# Patient Record
Sex: Female | Born: 1960 | Race: White | Hispanic: No | Marital: Married | Smoking: Never smoker
Health system: Southern US, Community
[De-identification: ages and names within clinical notes are randomized; demographics above are authoritative.]

## PROBLEM LIST (undated history)

## (undated) DIAGNOSIS — D649 Anemia, unspecified: Secondary | ICD-10-CM

## (undated) DIAGNOSIS — F419 Anxiety disorder, unspecified: Secondary | ICD-10-CM

## (undated) HISTORY — PX: ABDOMINAL HYSTERECTOMY: SHX81

## (undated) HISTORY — DX: Anxiety disorder, unspecified: F41.9

## (undated) HISTORY — DX: Anemia, unspecified: D64.9

## (undated) HISTORY — PX: OTHER SURGICAL HISTORY: SHX169

---

## 1997-11-30 ENCOUNTER — Other Ambulatory Visit: Admission: RE | Admit: 1997-11-30 | Discharge: 1997-11-30 | Payer: Self-pay | Admitting: Gynecology

## 1999-05-26 ENCOUNTER — Other Ambulatory Visit: Admission: RE | Admit: 1999-05-26 | Discharge: 1999-05-26 | Payer: Self-pay | Admitting: Obstetrics and Gynecology

## 1999-11-27 ENCOUNTER — Inpatient Hospital Stay (HOSPITAL_COMMUNITY): Admission: AD | Admit: 1999-11-27 | Discharge: 1999-11-29 | Payer: Self-pay | Admitting: Obstetrics and Gynecology

## 1999-12-30 ENCOUNTER — Other Ambulatory Visit: Admission: RE | Admit: 1999-12-30 | Discharge: 1999-12-30 | Payer: Self-pay | Admitting: Obstetrics and Gynecology

## 2001-01-26 ENCOUNTER — Other Ambulatory Visit: Admission: RE | Admit: 2001-01-26 | Discharge: 2001-01-26 | Payer: Self-pay | Admitting: Gynecology

## 2002-02-22 ENCOUNTER — Other Ambulatory Visit: Admission: RE | Admit: 2002-02-22 | Discharge: 2002-02-22 | Payer: Self-pay | Admitting: Gynecology

## 2006-05-13 ENCOUNTER — Ambulatory Visit: Payer: Self-pay | Admitting: Family Medicine

## 2007-09-21 LAB — HM PAP SMEAR: HM Pap smear: NORMAL

## 2007-09-27 ENCOUNTER — Ambulatory Visit: Payer: Self-pay | Admitting: Family Medicine

## 2009-02-27 ENCOUNTER — Ambulatory Visit: Payer: Self-pay

## 2009-11-08 ENCOUNTER — Ambulatory Visit: Payer: Self-pay | Admitting: Obstetrics and Gynecology

## 2009-11-19 ENCOUNTER — Ambulatory Visit: Payer: Self-pay | Admitting: Obstetrics and Gynecology

## 2009-11-19 HISTORY — PX: OTHER SURGICAL HISTORY: SHX169

## 2009-11-21 LAB — PATHOLOGY REPORT

## 2010-05-28 ENCOUNTER — Ambulatory Visit: Payer: Self-pay | Admitting: Family Medicine

## 2010-05-28 LAB — HM MAMMOGRAPHY: HM MAMMO: NEGATIVE

## 2010-06-09 ENCOUNTER — Ambulatory Visit: Payer: Self-pay | Admitting: Gastroenterology

## 2010-06-09 LAB — HM COLONOSCOPY

## 2011-07-09 ENCOUNTER — Ambulatory Visit: Payer: Self-pay | Admitting: Family Medicine

## 2011-07-10 ENCOUNTER — Ambulatory Visit: Payer: Self-pay | Admitting: Family Medicine

## 2012-07-26 ENCOUNTER — Ambulatory Visit: Payer: Self-pay | Admitting: Family Medicine

## 2012-07-27 ENCOUNTER — Ambulatory Visit: Payer: Self-pay | Admitting: Family Medicine

## 2014-10-09 DIAGNOSIS — F419 Anxiety disorder, unspecified: Secondary | ICD-10-CM | POA: Insufficient documentation

## 2014-10-09 DIAGNOSIS — D509 Iron deficiency anemia, unspecified: Secondary | ICD-10-CM | POA: Insufficient documentation

## 2014-10-09 DIAGNOSIS — E559 Vitamin D deficiency, unspecified: Secondary | ICD-10-CM | POA: Insufficient documentation

## 2014-10-09 DIAGNOSIS — F439 Reaction to severe stress, unspecified: Secondary | ICD-10-CM | POA: Insufficient documentation

## 2014-10-09 DIAGNOSIS — G4700 Insomnia, unspecified: Secondary | ICD-10-CM | POA: Insufficient documentation

## 2014-10-09 DIAGNOSIS — Z8759 Personal history of other complications of pregnancy, childbirth and the puerperium: Secondary | ICD-10-CM | POA: Insufficient documentation

## 2014-10-09 DIAGNOSIS — Z8619 Personal history of other infectious and parasitic diseases: Secondary | ICD-10-CM | POA: Insufficient documentation

## 2014-10-12 ENCOUNTER — Encounter: Payer: Self-pay | Admitting: Physician Assistant

## 2014-10-12 ENCOUNTER — Ambulatory Visit (INDEPENDENT_AMBULATORY_CARE_PROVIDER_SITE_OTHER): Payer: 59 | Admitting: Physician Assistant

## 2014-10-12 VITALS — BP 102/60 | HR 70 | Temp 98.8°F | Resp 16 | Ht 62.0 in | Wt 118.8 lb

## 2014-10-12 DIAGNOSIS — Z1239 Encounter for other screening for malignant neoplasm of breast: Secondary | ICD-10-CM

## 2014-10-12 DIAGNOSIS — Z23 Encounter for immunization: Secondary | ICD-10-CM | POA: Diagnosis not present

## 2014-10-12 DIAGNOSIS — Z0000 Encounter for general adult medical examination without abnormal findings: Secondary | ICD-10-CM | POA: Diagnosis not present

## 2014-10-12 DIAGNOSIS — F419 Anxiety disorder, unspecified: Secondary | ICD-10-CM | POA: Diagnosis not present

## 2014-10-12 MED ORDER — ALPRAZOLAM 0.5 MG PO TABS: 0.5000 mg | ORAL_TABLET | Freq: Three times a day (TID) | ORAL | Status: DC | PRN

## 2014-10-12 NOTE — Patient Instructions (Signed)

## 2014-10-12 NOTE — Progress Notes (Signed)
Patient: Denise Gregory, Female    DOB: 06-23-1960, 54 y.o.   MRN: [id number] Visit Date: 10/12/2014  Today's Provider: Margaretann Loveless, PA-C   Chief Complaint  Patient presents with  . Annual Exam   Subjective:    Annual physical exam Denise Gregory is a 54 y.o. female who presents today for health maintenance and complete physical. She feels well. She reports exercising, walks 10 minutes at fast pace everyday. She reports she is sleeping poorly.  She states that the Xanax that she had before did help her sleep. She has no difficulties falling asleep but states that she does have trouble staying asleep. She states that she will wake up a couple times throughout the night. She has no known reason why she is waking up and she does have a difficult time falling back asleep. She has tried melatonin and it caused her to have "weird dreams".  She would like to try the Xanax again as it helped previously. She has tried over-the-counter sleep aids as well and states that they made her feel hyperactive instead of sleepy. She has started having occasional hot flashes but states they are not debilitating yet and does not want to seek treatment at this time. She is status post hysterectomy with right oophorectomy. The hysterectomy was secondary to uterine fibroids. The right oophorectomy was secondary to a tubal pregnancy. She has not had any complications since the hysterectomy. She did not get her mammogram last year but did have one the year before. She states the last 2 years that she had her mammogram she had to go back for a diagnostic ultrasound one time and the questionable area was found to be a cyst. The second time they did a diagnostic mammogram and she was cleared. There is no family history of breast cancer. She did have her screening colonoscopy at age 6. It was reported normal and follow-up colonoscopy in 10 years. There is no family history of colon cancer. She does  have some scars over her chest where there were some skin cancer and dysplastic areas removed. She is followed regularly by a dermatologist in [place].   Last PCP:10/10/2013    Review of Systems  Constitutional: Positive for diaphoresis.  HENT: Negative.   Eyes: Negative.   Respiratory: Negative.   Cardiovascular: Negative.   Gastrointestinal: Negative.   Endocrine: Negative.   Genitourinary: Negative.   Musculoskeletal: Negative.   Skin: Negative.   Allergic/Immunologic: Negative.   Neurological: Negative.   Hematological: Negative.   Psychiatric/Behavioral: Positive for sleep disturbance.    Social History She  reports that she has never smoked. She does not have any smokeless tobacco history on file. She reports that she drinks alcohol. She reports that she does not use illicit drugs. Social History   Social History  . Marital Status: Married    Spouse Name: N/A  . Number of Children: N/A  . Years of Education: N/A   Social History Main Topics  . Smoking status: Never Smoker   . Smokeless tobacco: None  . Alcohol Use: Yes     Comment: 1-2 glasses a week  . Drug Use: No  . Sexual Activity: Not Asked   Other Topics Concern  . None   Social History Narrative    Patient Active Problem List   Diagnosis Date Noted  . Anxiety 10/09/2014  . History of chicken pox 10/09/2014  . Ectopic pregnancy 10/09/2014  . Cannot sleep 10/09/2014  .  Anemia, iron deficiency 10/09/2014  . Feeling stressed out 10/09/2014  . Avitaminosis D 10/09/2014    Past Surgical History  Procedure Laterality Date  . Status post hysterectomy  11/19/2009    Due to fibroids; Still has right ovary  . Status post left oophorectomy      Due to tubal pregnancy    Family History  Family Status  Relation Status Death Age  . Mother Alive   . Father Deceased 43    Died from Lung Cancer  . Paternal Grandmother      senile Osteoporosis   Her family history includes Coronary artery  disease in her father; Heart attack in her father; Liver cancer in her father.    Allergies  Allergen Reactions  . Codeine Nausea Only    Previous Medications   No medications on file    Patient Care Team: Margaretann Loveless, PA-C as PCP - General (Physician Assistant)     Objective:   Vitals: BP 102/60 mmHg  Pulse 70  Temp(Src) 98.8 F (37.1 C) (Oral)  Resp 16  Ht  (1.575 m)  Wt 118 lb 12.8 oz (53.887 kg)  BMI 21.72 kg/m2   Physical Exam  Constitutional: She is oriented to person, place, and time. She appears well-developed and well-nourished. No distress.  HENT:  Head: Normocephalic and atraumatic.  Right Ear: Hearing, tympanic membrane, external ear and ear canal normal.  Left Ear: Hearing, tympanic membrane, external ear and ear canal normal.  Nose: Nose normal.  Mouth/Throat: Uvula is midline, oropharynx is clear and moist and mucous membranes are normal. No oropharyngeal exudate.  Eyes: Conjunctivae and EOM are normal. Pupils are equal, round, and reactive to light. Right eye exhibits no discharge. Left eye exhibits no discharge. No scleral icterus.  Neck: Normal range of motion. Neck supple. No JVD present. Carotid bruit is not present. No tracheal deviation present. No thyromegaly present.    Cardiovascular: Normal rate, regular rhythm, normal heart sounds and intact distal pulses.  Exam reveals no gallop and no friction rub.   No murmur heard. Pulmonary/Chest: Effort normal and breath sounds normal. No respiratory distress. She has no wheezes. She has no rales. She exhibits no tenderness. Right breast exhibits no inverted nipple, no mass, no nipple discharge, no skin change and no tenderness. Left breast exhibits no inverted nipple, no mass, no nipple discharge, no skin change and no tenderness. Breasts are symmetrical.  Abdominal: Soft. Bowel sounds are normal. She exhibits no distension and no mass. There is no tenderness. There is no rebound and no guarding.   Musculoskeletal: Normal range of motion. She exhibits no edema or tenderness.  Lymphadenopathy:    She has no cervical adenopathy.  Neurological: She is alert and oriented to person, place, and time.  Skin: Skin is warm and dry. No rash noted. She is not diaphoretic.  Psychiatric: She has a normal mood and affect. Her behavior is normal. Judgment and thought content normal.  Vitals reviewed.    Depression Screen No flowsheet data found.    Assessment & Plan:     Routine Health Maintenance and Physical Exam  Exercise Activities and Dietary recommendations Goals    None      Immunization History  Administered Date(s) Administered  . Influenza,inj,Quad PF,36+ Mos 10/12/2014  . Tdap 05/06/2010    Health Maintenance  Topic Date Due  . Hepatitis C Screening  1960-07-07  . HIV Screening  04/12/1975  . PAP SMEAR  09/21/2010  . MAMMOGRAM  05/27/2012  .  INFLUENZA VACCINE  08/20/2014  . COLONOSCOPY  06/09/2015  . TETANUS/TDAP  05/05/2020      Discussed health benefits of physical activity, and encouraged her to engage in regular exercise appropriate for her age and condition.   1. Annual physical exam Physical exam today was normal. She did have her lipids and hemoglobin A1c checked just a couple of months ago in the summer for her health screening biometrics through her employer. I will check other labs that were not checked on the biometrics screening as below. We will follow-up pending lab results or in one year if labs are normal. - CBC with Differential - Comprehensive metabolic panel - TSH  2. Need for influenza vaccination Flu vaccine was given today in the office. She tolerated this well and there were no complications. - Flu Vaccine QUAD 36+ mos IM  3. Breast cancer screening Breast exam today in the office was normal. She does have dense breast tissue. Screening mammogram was ordered and phone number for normal breast clinic was given to patient so that she  may call to schedule that appointment. - Mammogram Digital Screening; Future  4. Acute anxiety She uses the Xanax mostly for sleep. Occasionally she will have to take an extra pill throughout the day but that is very rare she states. She has been out of her Xanax for a while now she says. I will refill her Xanax as below. She is to call the office if she has any worsening symptoms. - ALPRAZolam (XANAX) 0.5 MG tablet; Take 1 tablet (0.5 mg total) by mouth every 8 (eight) hours as needed.  Dispense: 30 tablet; Refill: 1  --------------------------------------------------------------------

## 2014-10-13 LAB — CBC WITH DIFFERENTIAL/PLATELET
BASOS ABS: 0 10*3/uL (ref 0.0–0.2)
Basos: 0 %
EOS (ABSOLUTE): 0.2 10*3/uL (ref 0.0–0.4)
Eos: 2 %
Hematocrit: 41.1 % (ref 34.0–46.6)
Hemoglobin: 13.8 g/dL (ref 11.1–15.9)
IMMATURE GRANS (ABS): 0 10*3/uL (ref 0.0–0.1)
Immature Granulocytes: 0 %
LYMPHS: 31 %
Lymphocytes Absolute: 2.8 10*3/uL (ref 0.7–3.1)
MCH: 31.2 pg (ref 26.6–33.0)
MCHC: 33.6 g/dL (ref 31.5–35.7)
MCV: 93 fL (ref 79–97)
MONOS ABS: 0.7 10*3/uL (ref 0.1–0.9)
Monocytes: 8 %
NEUTROS PCT: 59 %
Neutrophils Absolute: 5.3 10*3/uL (ref 1.4–7.0)
PLATELETS: 309 10*3/uL (ref 150–379)
RBC: 4.43 x10E6/uL (ref 3.77–5.28)
RDW: 12.9 % (ref 12.3–15.4)
WBC: 9 10*3/uL (ref 3.4–10.8)

## 2014-10-13 LAB — COMPREHENSIVE METABOLIC PANEL
ALK PHOS: 63 IU/L (ref 39–117)
ALT: 42 IU/L — AB (ref 0–32)
AST: 35 IU/L (ref 0–40)
Albumin/Globulin Ratio: 1.9 (ref 1.1–2.5)
Albumin: 4.7 g/dL (ref 3.5–5.5)
BILIRUBIN TOTAL: 0.3 mg/dL (ref 0.0–1.2)
BUN/Creatinine Ratio: 31 — ABNORMAL HIGH (ref 9–23)
BUN: 16 mg/dL (ref 6–24)
CHLORIDE: 98 mmol/L (ref 97–108)
CO2: 22 mmol/L (ref 18–29)
Calcium: 10.2 mg/dL (ref 8.7–10.2)
Creatinine, Ser: 0.52 mg/dL — ABNORMAL LOW (ref 0.57–1.00)
GFR calc Af Amer: 125 mL/min/{1.73_m2} (ref >59)
GFR calc non Af Amer: 109 mL/min/{1.73_m2} (ref >59)
GLUCOSE: 90 mg/dL (ref 65–99)
Globulin, Total: 2.5 g/dL (ref 1.5–4.5)
POTASSIUM: 4.7 mmol/L (ref 3.5–5.2)
Sodium: 140 mmol/L (ref 134–144)
TOTAL PROTEIN: 7.2 g/dL (ref 6.0–8.5)

## 2014-10-13 LAB — TSH: TSH: 1.93 u[IU]/mL (ref 0.450–4.500)

## 2014-10-15 ENCOUNTER — Telehealth: Payer: Self-pay

## 2014-10-15 NOTE — Telephone Encounter (Signed)
-----   Message from Margaretann Loveless, [place] sent at 10/15/2014  3:21 PM EDT ----- All labs are within normal limits and stable.  Thanks! -JB

## 2014-10-15 NOTE — Telephone Encounter (Signed)
LMTCB 10/15/14  Thanks,  -Joseline 

## 2014-10-16 NOTE — Telephone Encounter (Signed)
Patient advised as directed below.  Thanks,  -Joseline 

## 2014-10-16 NOTE — Telephone Encounter (Signed)
LMTCB  Thanks,  -Joseline 

## 2014-10-26 ENCOUNTER — Ambulatory Visit
Admission: RE | Admit: 2014-10-26 | Discharge: 2014-10-26 | Disposition: A | Discharge status: Home / Self Care | Payer: 59 | Source: Ambulatory Visit | Attending: Physician Assistant | Admitting: Physician Assistant

## 2014-10-26 ENCOUNTER — Telehealth: Payer: Self-pay

## 2014-10-26 DIAGNOSIS — Z1239 Encounter for other screening for malignant neoplasm of breast: Secondary | ICD-10-CM

## 2014-10-26 DIAGNOSIS — Z1231 Encounter for screening mammogram for malignant neoplasm of breast: Secondary | ICD-10-CM | POA: Insufficient documentation

## 2014-10-26 NOTE — Telephone Encounter (Signed)
Advised pt as directed. Pt verbalized fully understanding.  Thanks,   

## 2014-10-26 NOTE — Telephone Encounter (Signed)
LMTCB 10/26/14  Thanks,  -Joseline 

## 2014-10-26 NOTE — Telephone Encounter (Signed)
-----   Message from Margaretann Loveless, PA-C sent at 10/26/2014 11:14 AM EDT ----- Normal mammogram.

## 2015-09-24 LAB — HM COLONOSCOPY

## 2015-10-14 ENCOUNTER — Encounter: Payer: Self-pay | Admitting: Physician Assistant

## 2015-10-14 ENCOUNTER — Ambulatory Visit (INDEPENDENT_AMBULATORY_CARE_PROVIDER_SITE_OTHER): Payer: 59 | Admitting: Physician Assistant

## 2015-10-14 VITALS — BP 112/70 | HR 72 | Temp 98.3°F | Resp 16 | Ht 62.0 in | Wt 124.6 lb

## 2015-10-14 DIAGNOSIS — Z833 Family history of diabetes mellitus: Secondary | ICD-10-CM | POA: Diagnosis not present

## 2015-10-14 DIAGNOSIS — Z0000 Encounter for general adult medical examination without abnormal findings: Secondary | ICD-10-CM

## 2015-10-14 DIAGNOSIS — Z136 Encounter for screening for cardiovascular disorders: Secondary | ICD-10-CM | POA: Diagnosis not present

## 2015-10-14 DIAGNOSIS — Z1159 Encounter for screening for other viral diseases: Secondary | ICD-10-CM

## 2015-10-14 DIAGNOSIS — Z1239 Encounter for other screening for malignant neoplasm of breast: Secondary | ICD-10-CM

## 2015-10-14 DIAGNOSIS — Z23 Encounter for immunization: Secondary | ICD-10-CM | POA: Diagnosis not present

## 2015-10-14 DIAGNOSIS — D509 Iron deficiency anemia, unspecified: Secondary | ICD-10-CM

## 2015-10-14 DIAGNOSIS — Z13220 Encounter for screening for lipoid disorders: Secondary | ICD-10-CM

## 2015-10-14 DIAGNOSIS — G4700 Insomnia, unspecified: Secondary | ICD-10-CM | POA: Diagnosis not present

## 2015-10-14 DIAGNOSIS — N951 Menopausal and female climacteric states: Secondary | ICD-10-CM

## 2015-10-14 DIAGNOSIS — R454 Irritability and anger: Secondary | ICD-10-CM | POA: Diagnosis not present

## 2015-10-14 MED ORDER — ALPRAZOLAM 0.5 MG PO TABS: 0.5000 mg | ORAL_TABLET | Freq: Every evening | ORAL | 3 refills | Status: DC | PRN

## 2015-10-14 NOTE — Progress Notes (Signed)
Patient: Denise Gregory, Female    DOB: 31-Aug-1960, 55 y.o.   MRN: [id number] Visit Date: 10/14/2015  Today's Provider: Margaretann Loveless, PA-C   Chief Complaint  Patient presents with  . Annual Exam   Subjective:    Annual physical exam Denise Gregory is a 55 y.o. female who presents today for health maintenance and complete physical. She feels well. She reports exercising none. She reports she is sleeping poorly (6-7 hours but it is broken). She reports that she has trouble staying asleep. She takes Benadryl sometimes. She also uses alprazolam for sleep. She reports increasing hot flashes and irritability. She does not wish to start medications at this time. Curious of OTC treatments first.   Last PCP: 10/12/14 Mammogram: 10/26/2014-BI-RADS Category 1: Negative Pap: 06/09/10; now s/p hysterectomy-no abnormal paps; no personal or family history of cervical/uterine or ovarian cancer Colonoscopy: 06/09/2010; most recent colonoscopy was 09/24/15 with Dr. Mechele Collin; repeat in 3 years -----------------------------------------------------------------   Review of Systems  Constitutional: Positive for unexpected weight change.       Irritability  HENT: Negative.   Eyes: Negative.   Respiratory: Negative.   Cardiovascular: Negative.   Gastrointestinal: Negative.   Endocrine: Negative.   Genitourinary: Negative.   Musculoskeletal: Negative.   Skin: Negative.   Allergic/Immunologic: Negative.   Neurological: Negative.   Hematological: Negative.   Psychiatric/Behavioral: Positive for dysphoric mood. The patient is nervous/anxious.   All other systems reviewed and are negative.   Social History      She  reports that she has never smoked. She has never used smokeless tobacco. She reports that she drinks alcohol. She reports that she does not use drugs.       Social History   Social History  . Marital status: Married    Spouse name: N/A  . Number of  children: N/A  . Years of education: N/A   Social History Main Topics  . Smoking status: Never Smoker  . Smokeless tobacco: Never Used  . Alcohol use Yes     Comment: 1-2 glasses a week  . Drug use: No  . Sexual activity: Not Asked   Other Topics Concern  . None   Social History Narrative  . None    Past Medical History:  Diagnosis Date  . Anemia   . Anxiety      Patient Active Problem List   Diagnosis Date Noted  . Anxiety 10/09/2014  . History of chicken pox 10/09/2014  . Ectopic pregnancy 10/09/2014  . Cannot sleep 10/09/2014  . Anemia, iron deficiency 10/09/2014  . Feeling stressed out 10/09/2014  . Avitaminosis D 10/09/2014    Past Surgical History:  Procedure Laterality Date  . Status post hysterectomy  11/19/2009   Due to fibroids; Still has right ovary  . status post left oophorectomy     Due to tubal pregnancy    Family History        Family Status  Relation Status  . Father Deceased at age 6   Died from Lung Cancer  . Mother Alive  . Paternal Grandmother    senile Osteoporosis  . Neg Hx         Her family history includes Coronary artery disease in her father; Heart attack in her father; Liver cancer in her father.    Allergies  Allergen Reactions  . Codeine Nausea Only    Current Meds  Medication Sig  . ALPRAZolam (XANAX) 0.5 MG tablet Take 1  tablet (0.5 mg total) by mouth every 8 (eight) hours as needed.    Patient Care Team: Margaretann Loveless, PA-C as PCP - General (Physician Assistant)     Objective:   Vitals: BP 112/70 (BP Location: Right Arm, Patient Position: Sitting, Cuff Size: Normal)   Pulse 72   Temp 98.3 F (36.8 C) (Oral)   Resp 16   Ht 5\' 2"  (1.575 m)   Wt 124 lb 9.6 oz (56.5 kg)   BMI 22.79 kg/m    Physical Exam  Constitutional: She is oriented to person, place, and time. She appears well-developed and well-nourished. No distress.  HENT:  Head: Normocephalic and atraumatic.  Right Ear: Hearing, tympanic  membrane, external ear and ear canal normal.  Left Ear: Hearing, tympanic membrane, external ear and ear canal normal.  Nose: Nose normal.  Mouth/Throat: Uvula is midline, oropharynx is clear and moist and mucous membranes are normal. No oropharyngeal exudate.  Eyes: Conjunctivae and EOM are normal. Pupils are equal, round, and reactive to light. Right eye exhibits no discharge. Left eye exhibits no discharge. No scleral icterus.  Neck: Normal range of motion. Neck supple. No JVD present. Carotid bruit is not present. No tracheal deviation present. No thyromegaly present.    Cardiovascular: Normal rate, regular rhythm, normal heart sounds and intact distal pulses.  Exam reveals no gallop and no friction rub.   No murmur heard. Pulmonary/Chest: Effort normal and breath sounds normal. No respiratory distress. She has no wheezes. She has no rales. She exhibits no tenderness. Right breast exhibits no inverted nipple, no mass, no nipple discharge, no skin change and no tenderness. Left breast exhibits no inverted nipple, no mass, no nipple discharge, no skin change and no tenderness. Breasts are symmetrical.  Abdominal: Soft. Bowel sounds are normal. She exhibits no distension and no mass. There is no tenderness. There is no rebound and no guarding.  Musculoskeletal: Normal range of motion. She exhibits no edema or tenderness.  Lymphadenopathy:    She has no cervical adenopathy.  Neurological: She is alert and oriented to person, place, and time.  Skin: Skin is warm and dry. No rash noted. She is not diaphoretic.  Psychiatric: She has a normal mood and affect. Her behavior is normal. Judgment and thought content normal.  Vitals reviewed.   Depression Screen No flowsheet data found.   Assessment & Plan:     Routine Health Maintenance and Physical Exam  Exercise Activities and Dietary recommendations Goals    None      Immunization History  Administered Date(s) Administered  .  Influenza,inj,Quad PF,36+ Mos 10/12/2014  . Tdap 05/06/2010    Health Maintenance  Topic Date Due  . Hepatitis C Screening  [birth year]  . HIV Screening  04/12/1975  . PAP SMEAR  09/21/2010  . COLONOSCOPY  06/09/2015  . INFLUENZA VACCINE  08/20/2015  . MAMMOGRAM  10/25/2016  . TETANUS/TDAP  05/05/2020      Discussed health benefits of physical activity, and encouraged her to engage in regular exercise appropriate for her age and condition.    1. Annual physical exam Normal physical exam today. Will check labs as below and f/u pending lab results. If labs are stable and WNL she will not need to have these rechecked for one year at her next annual physical exam. She is to call the office in the meantime if she has any acute issue, questions or concerns. - CBC with Differential/Platelet - Comprehensive metabolic panel - TSH  2. Breast cancer  screening Breast exam today was normal. There is no family history of breast cancer. She does perform regular self breast exams. Mammogram was ordered as below. [facility] was given to patient so she may schedule her mammogram at her convenience. - MM DIGITAL SCREENING BILATERAL; Future  3. Encounter for lipid screening for cardiovascular disease Will check labs as below and f/u pending results. - Lipid panel  4. Family history of diabetes mellitus (DM) Will check labs as below and f/u pending results. - Hemoglobin A1c  5. Anemia, iron deficiency Will check labs as below and f/u pending results. - CBC with Differential/Platelet  6. Cannot sleep Stable. Diagnosis pulled for medication refill. Continue current medical treatment plan. - ALPRAZolam (XANAX) 0.5 MG tablet; Take 1 tablet (0.5 mg total) by mouth at bedtime as needed.  Dispense: 30 tablet; Refill: 3  7. Irritability Worsening irritability and mood swings with hot flashes. Discussed menopausal symptoms in detail. She is going to start with Black cohosh  over-the-counter. If this does not help her symptoms she is to call the office. We did discuss nonhormonal versus hormonal treatments and the risk and benefits of each. If the black cohosh does not work she will let me know which she would prefer to start with.  8. Hot flash, menopausal See above medical treatment plan.  9. Need for hepatitis C screening test - Hepatitis C antibody  10. Need for influenza vaccination Flu vaccine given today without complication. Patient sat upright for 15 minutes to check for adverse reaction before being released. - Flu Vaccine QUAD 36+ mos IM  --------------------------------------------------------------------    Margaretann LovelessJennifer M Burnette, PA-C  [facility]

## 2015-10-14 NOTE — Patient Instructions (Signed)

## 2015-11-05 ENCOUNTER — Ambulatory Visit
Admission: RE | Admit: 2015-11-05 | Discharge: 2015-11-05 | Disposition: A | Discharge status: Home / Self Care | Payer: 59 | Source: Ambulatory Visit | Attending: Physician Assistant | Admitting: Physician Assistant

## 2015-11-05 DIAGNOSIS — Z1231 Encounter for screening mammogram for malignant neoplasm of breast: Secondary | ICD-10-CM | POA: Diagnosis not present

## 2015-11-05 DIAGNOSIS — Z1239 Encounter for other screening for malignant neoplasm of breast: Secondary | ICD-10-CM

## 2015-11-06 ENCOUNTER — Telehealth: Payer: Self-pay

## 2015-11-06 NOTE — Telephone Encounter (Signed)
Left message to call back  

## 2015-11-06 NOTE — Telephone Encounter (Signed)
-----   Message from Margaretann LovelessJennifer M Burnette, PA-C sent at 11/06/2015  8:22 AM EDT ----- Normal mammogram. Repeat screening in one year.

## 2015-11-06 NOTE — Telephone Encounter (Signed)
Pt is returning call.  [phone]/[id number]#[phone]/MW

## 2015-11-06 NOTE — Telephone Encounter (Signed)
Tried calling patient. Left message to call back. 

## 2015-11-06 NOTE — Telephone Encounter (Signed)
Patient advised and verbally voiced understanding.  

## 2016-07-29 ENCOUNTER — Encounter: Payer: Self-pay | Admitting: Physician Assistant

## 2016-07-29 ENCOUNTER — Ambulatory Visit (INDEPENDENT_AMBULATORY_CARE_PROVIDER_SITE_OTHER): Payer: 59 | Admitting: Physician Assistant

## 2016-07-29 VITALS — BP 110/70 | Temp 98.1°F | Resp 16 | Wt 129.0 lb

## 2016-07-29 DIAGNOSIS — R140 Abdominal distension (gaseous): Secondary | ICD-10-CM

## 2016-07-29 DIAGNOSIS — R1031 Right lower quadrant pain: Secondary | ICD-10-CM

## 2016-07-29 DIAGNOSIS — R197 Diarrhea, unspecified: Secondary | ICD-10-CM

## 2016-07-29 DIAGNOSIS — Z8759 Personal history of other complications of pregnancy, childbirth and the puerperium: Secondary | ICD-10-CM

## 2016-07-29 NOTE — Progress Notes (Signed)
Patient: Denise HuxleyClaudette E Pietila Female    DOB: 1960-09-30   56 y.o.   MRN: [id number] Visit Date: 07/29/2016  Today's Provider: Margaretann LovelessJennifer M Burnette, PA-C   Chief Complaint  Patient presents with  . Diarrhea   Subjective:    HPI Patient here today C/O diarrhea x's several weeks. Patient reports diarrhea is worse in the mornings having 3-4 from 6-10 am. Patient reports diarrhea also happens at night time. Patient reports bloating, cramping and passing a lot of gas. Patient denies any fever, nausea, vomiting, weight changes or blood in stools. Patient reports her family is well and has not had any similar symptoms. Patient reports taking one dose of OTC medication for diarrhea, reports no improvement. Patient denies UTI symptoms. Patient reports having colonoscopy last year in September 2017 that was normal. She has not been camping, no recent travel, no reptilian pets, no recent antibiotic use.     Allergies  Allergen Reactions  . Codeine Nausea Only     Current Outpatient Prescriptions:  .  ALPRAZolam (XANAX) 0.5 MG tablet, Take 1 tablet (0.5 mg total) by mouth at bedtime as needed., Disp: 30 tablet, Rfl: 3  Review of Systems  Constitutional: Negative for chills, fatigue and fever.  HENT: Negative.   Respiratory: Negative.   Cardiovascular: Negative.   Gastrointestinal: Positive for abdominal distention, abdominal pain and diarrhea. Negative for anal bleeding, blood in stool, constipation, nausea, rectal pain and vomiting.  Musculoskeletal: Negative for back pain.  Neurological: Negative.   Psychiatric/Behavioral: Negative.     Social History  Substance Use Topics  . Smoking status: Never Smoker  . Smokeless tobacco: Never Used  . Alcohol use Yes     Comment: 1-2 glasses a week   Objective:   There were no vitals taken for this visit. There were no vitals filed for this visit.   Physical Exam  Constitutional: She is oriented to person, place, and time. She  appears well-developed and well-nourished. No distress.  Cardiovascular: Normal rate, regular rhythm and normal heart sounds.  Exam reveals no gallop and no friction rub.   No murmur heard. Pulmonary/Chest: Effort normal and breath sounds normal. No respiratory distress. She has no wheezes. She has no rales.  Abdominal: Soft. Normal appearance and bowel sounds are normal. She exhibits no distension and no mass. There is no hepatosplenomegaly. There is tenderness in the right lower quadrant. There is guarding (rlq). There is no rebound and no CVA tenderness.  Neurological: She is alert and oriented to person, place, and time.  Skin: Skin is warm and dry. She is not diaphoretic.  Vitals reviewed.      Assessment & Plan:     1. Diarrhea, unspecified type DDx: subacute appendicitis, adhesions, functional diarrhea, constipation, infectious cause. Will check labs as below. I will get CT abdomen as below to r/o obstruction/adhesions vs appendicitis vs malignancy.  - CBC with Differential - Comprehensive Metabolic Panel (CMET) - Stool Culture - Stool C-Diff Toxin Assay - Ova and parasite examination - CT Abdomen Pelvis W Contrast; Future  2. RLQ abdominal pain See above medical treatment plan. - CBC with Differential - Comprehensive Metabolic Panel (CMET) - Stool Culture - Stool C-Diff Toxin Assay - Ova and parasite examination - CT Abdomen Pelvis W Contrast; Future  3. History of ectopic pregnancy Had oopherectomy many years ago due to ectopic pregnancy. She cannot remember now which side but believes it was the right side. This could lead to possibility of adhesions.  If it was the left side then I want to R/O any ovarian abnormality that may be causing a mass effect on the bowels.  - CBC with Differential - Comprehensive Metabolic Panel (CMET) - Stool Culture - Stool C-Diff Toxin Assay - Ova and parasite examination - CT Abdomen Pelvis W Contrast; Future  4. Bloating See above  medical treatment plan. - CBC with Differential - Comprehensive Metabolic Panel (CMET) - Stool Culture - Stool C-Diff Toxin Assay - Ova and parasite examination - CT Abdomen Pelvis W Contrast; Future       Margaretann Loveless, PA-C  [facility]

## 2016-07-29 NOTE — Patient Instructions (Signed)
Food Choices to Help Relieve Diarrhea, Adult When you have diarrhea, the foods you eat and your eating habits are very important. Choosing the right foods and drinks can help:  Relieve diarrhea.  Replace lost fluids and nutrients.  Prevent dehydration.  What general guidelines should I follow? Relieving diarrhea  Choose foods with less than 2 g or .07 oz. of fiber per serving.  Limit fats to less than 8 tsp (38 g or 1.34 oz.) a day.  Avoid the following: ? Foods and beverages sweetened with high-fructose corn syrup, honey, or sugar alcohols such as xylitol, sorbitol, and mannitol. ? Foods that contain a lot of fat or sugar. ? Fried, greasy, or spicy foods. ? High-fiber grains, breads, and cereals. ? Raw fruits and vegetables.  Eat foods that are rich in probiotics. These foods include dairy products such as yogurt and fermented milk products. They help increase healthy bacteria in the stomach and intestines (gastrointestinal tract, or GI tract).  If you have lactose intolerance, avoid dairy products. These may make your diarrhea worse.  Take medicine to help stop diarrhea (antidiarrheal medicine) only as told by your health care provider. Replacing nutrients  Eat small meals or snacks every 3-4 hours.  Eat bland foods, such as white rice, toast, or baked potato, until your diarrhea starts to get better. Gradually reintroduce nutrient-rich foods as tolerated or as told by your health care provider. This includes: ? Well-cooked protein foods. ? Peeled, seeded, and soft-cooked fruits and vegetables. ? Low-fat dairy products.  Take vitamin and mineral supplements as told by your health care provider. Preventing dehydration   Start by sipping water or a special solution to prevent dehydration (oral rehydration solution, ORS). Urine that is clear or pale yellow means that you are getting enough fluid.  Try to drink at least 8-10 cups of fluid each day to help replace lost  fluids.  You may add other liquids in addition to water, such as clear juice or decaffeinated sports drinks, as tolerated or as told by your health care provider.  Avoid drinks with caffeine, such as coffee, tea, or soft drinks.  Avoid alcohol. What foods are recommended? The items listed may not be a complete list. Talk with your health care provider about what dietary choices are best for you. Grains White rice. White, French, or pita breads (fresh or toasted), including plain rolls, buns, or bagels. White pasta. Saltine, soda, or graham crackers. Pretzels. Low-fiber cereal. Cooked cereals made with water (such as cornmeal, farina, or cream cereals). Plain muffins. Matzo. Melba toast. Zwieback. Vegetables Potatoes (without the skin). Most well-cooked and canned vegetables without skins or seeds. Tender lettuce. Fruits Apple sauce. Fruits canned in juice. Cooked apricots, cherries, grapefruit, peaches, pears, or plums. Fresh bananas and cantaloupe. Meats and other protein foods Baked or boiled chicken. Eggs. Tofu. Fish. Seafood. Smooth nut butters. Ground or well-cooked tender beef, ham, veal, lamb, pork, or poultry. Dairy Plain yogurt, kefir, and unsweetened liquid yogurt. Lactose-free milk, buttermilk, skim milk, or soy milk. Low-fat or nonfat hard cheese. Beverages Water. Low-calorie sports drinks. Fruit juices without pulp. Strained tomato and vegetable juices. Decaffeinated teas. Sugar-free beverages not sweetened with sugar alcohols. Oral rehydration solutions, if approved by your health care provider. Seasoning and other foods Bouillon, broth, or soups made from recommended foods. What foods are not recommended? The items listed may not be a complete list. Talk with your health care provider about what dietary choices are best for you. Grains Whole grain, whole wheat,   bran, or rye breads, rolls, pastas, and crackers. Wild or brown rice. Whole grain or bran cereals. Barley. Oats and  oatmeal. Corn tortillas or taco shells. Granola. Popcorn. Vegetables Raw vegetables. Fried vegetables. Cabbage, broccoli, Brussels sprouts, artichokes, baked beans, beet greens, corn, kale, legumes, peas, sweet potatoes, and yams. Potato skins. Cooked spinach and cabbage. Fruits Dried fruit, including raisins and dates. Raw fruits. Stewed or dried prunes. Canned fruits with syrup. Meat and other protein foods Fried or fatty meats. Deli meats. Chunky nut butters. Nuts and seeds. Beans and lentils. Bacon. Hot dogs. Sausage. Dairy High-fat cheeses. Whole milk, chocolate milk, and beverages made with milk, such as milk shakes. Half-and-half. Cream. sour cream. Ice cream. Beverages Caffeinated beverages (such as coffee, tea, soda, or energy drinks). Alcoholic beverages. Fruit juices with pulp. Prune juice. Soft drinks sweetened with high-fructose corn syrup or sugar alcohols. High-calorie sports drinks. Fats and oils Butter. Cream sauces. Margarine. Salad oils. Plain salad dressings. Olives. Avocados. Mayonnaise. Sweets and desserts Sweet rolls, doughnuts, and sweet breads. Sugar-free desserts sweetened with sugar alcohols such as xylitol and sorbitol. Seasoning and other foods Honey. Hot sauce. Chili powder. Gravy. Cream-based or milk-based soups. Pancakes and waffles. Summary  When you have diarrhea, the foods you eat and your eating habits are very important.  Make sure you get at least 8-10 cups of fluid each day, or enough to keep your urine clear or pale yellow.  Eat bland foods and gradually reintroduce healthy, nutrient-rich foods as tolerated, or as told by your health care provider.  Avoid high-fiber, fried, greasy, or spicy foods. This information is not intended to replace advice given to you by your health care provider. Make sure you discuss any questions you have with your health care provider. Document Released: 03/28/2003 Document Revised: 01/03/2016 Document Reviewed:  01/03/2016 Elsevier Interactive Patient Education  2017 Else[employer] Inc[employer]  

## 2016-07-30 ENCOUNTER — Encounter: Payer: Self-pay | Admitting: Physician Assistant

## 2016-07-30 LAB — COMPREHENSIVE METABOLIC PANEL
ALBUMIN: 4.6 g/dL (ref 3.5–5.5)
ALK PHOS: 63 IU/L (ref 39–117)
ALT: 25 IU/L (ref 0–32)
AST: 24 IU/L (ref 0–40)
Albumin/Globulin Ratio: 2.3 — ABNORMAL HIGH (ref 1.2–2.2)
BILIRUBIN TOTAL: 0.3 mg/dL (ref 0.0–1.2)
BUN / CREAT RATIO: 12 (ref 9–23)
BUN: 7 mg/dL (ref 6–24)
CHLORIDE: 103 mmol/L (ref 96–106)
CO2: 23 mmol/L (ref 20–29)
Calcium: 9.5 mg/dL (ref 8.7–10.2)
Creatinine, Ser: 0.59 mg/dL (ref 0.57–1.00)
GFR calc Af Amer: 118 mL/min/{1.73_m2} (ref >59)
GFR calc non Af Amer: 103 mL/min/{1.73_m2} (ref >59)
GLOBULIN, TOTAL: 2 g/dL (ref 1.5–4.5)
Glucose: 84 mg/dL (ref 65–99)
POTASSIUM: 3.8 mmol/L (ref 3.5–5.2)
SODIUM: 142 mmol/L (ref 134–144)
Total Protein: 6.6 g/dL (ref 6.0–8.5)

## 2016-07-30 LAB — CBC WITH DIFFERENTIAL/PLATELET
BASOS ABS: 0 10*3/uL (ref 0.0–0.2)
Basos: 0 %
EOS (ABSOLUTE): 0.1 10*3/uL (ref 0.0–0.4)
Eos: 1 %
HEMATOCRIT: 38.8 % (ref 34.0–46.6)
Hemoglobin: 13.3 g/dL (ref 11.1–15.9)
Immature Grans (Abs): 0 10*3/uL (ref 0.0–0.1)
Immature Granulocytes: 0 %
LYMPHS ABS: 2.6 10*3/uL (ref 0.7–3.1)
Lymphs: 32 %
MCH: 32.3 pg (ref 26.6–33.0)
MCHC: 34.3 g/dL (ref 31.5–35.7)
MCV: 94 fL (ref 79–97)
MONOS ABS: 0.6 10*3/uL (ref 0.1–0.9)
Monocytes: 7 %
NEUTROS ABS: 4.8 10*3/uL (ref 1.4–7.0)
Neutrophils: 60 %
Platelets: 282 10*3/uL (ref 150–379)
RBC: 4.12 x10E6/uL (ref 3.77–5.28)
RDW: 14.2 % (ref 12.3–15.4)
WBC: 8 10*3/uL (ref 3.4–10.8)

## 2016-08-05 ENCOUNTER — Telehealth: Payer: Self-pay | Admitting: Emergency Medicine

## 2016-08-05 LAB — CLOSTRIDIUM DIFFICILE EIA: C difficile Toxins A+B, EIA: NEGATIVE

## 2016-08-05 LAB — STOOL CULTURE: E COLI SHIGA TOXIN ASSAY: NEGATIVE

## 2016-08-05 NOTE — Telephone Encounter (Signed)
[email or web address]

## 2016-08-05 NOTE — Telephone Encounter (Signed)
-----   Message from Margaretann LovelessJennifer M Burnette, PA-C sent at 07/30/2016  1:26 PM EDT ----- Blood work is normal. Stool testing still pending

## 2016-08-06 ENCOUNTER — Ambulatory Visit: Payer: 59

## 2016-08-06 NOTE — Progress Notes (Signed)
Advised, patient states her symptoms are the same

## 2016-08-10 ENCOUNTER — Telehealth: Payer: Self-pay | Admitting: Physician Assistant

## 2016-08-10 NOTE — Addendum Note (Signed)
Addended by: Margaretann LovelessBURNETTE, [place] M on: 08/10/2016 01:20 PM   Modules accepted: Orders

## 2016-08-10 NOTE — Telephone Encounter (Signed)
Noted  

## 2016-08-10 NOTE — Telephone Encounter (Signed)
Pt called saying she was going to cancel her appt for the CT of Abd Pel.  She said this was going to cost too much money.  Her call back is  [phone]  Thanks teri

## 2016-08-12 ENCOUNTER — Ambulatory Visit: Admission: RE | Admit: 2016-08-12 | Payer: 59 | Source: Ambulatory Visit

## 2016-10-26 ENCOUNTER — Ambulatory Visit (INDEPENDENT_AMBULATORY_CARE_PROVIDER_SITE_OTHER): Payer: 59 | Admitting: Physician Assistant

## 2016-10-26 ENCOUNTER — Encounter: Payer: Self-pay | Admitting: Physician Assistant

## 2016-10-26 VITALS — BP 130/82 | HR 88 | Temp 97.8°F | Resp 16 | Ht 62.0 in | Wt 125.0 lb

## 2016-10-26 DIAGNOSIS — F5101 Primary insomnia: Secondary | ICD-10-CM

## 2016-10-26 DIAGNOSIS — F419 Anxiety disorder, unspecified: Secondary | ICD-10-CM | POA: Diagnosis not present

## 2016-10-26 DIAGNOSIS — Z23 Encounter for immunization: Secondary | ICD-10-CM

## 2016-10-26 DIAGNOSIS — Z0000 Encounter for general adult medical examination without abnormal findings: Secondary | ICD-10-CM

## 2016-10-26 DIAGNOSIS — E559 Vitamin D deficiency, unspecified: Secondary | ICD-10-CM | POA: Diagnosis not present

## 2016-10-26 DIAGNOSIS — Z1239 Encounter for other screening for malignant neoplasm of breast: Secondary | ICD-10-CM

## 2016-10-26 DIAGNOSIS — Z1231 Encounter for screening mammogram for malignant neoplasm of breast: Secondary | ICD-10-CM

## 2016-10-26 DIAGNOSIS — D509 Iron deficiency anemia, unspecified: Secondary | ICD-10-CM | POA: Diagnosis not present

## 2016-10-26 DIAGNOSIS — Z1159 Encounter for screening for other viral diseases: Secondary | ICD-10-CM

## 2016-10-26 MED ORDER — ALPRAZOLAM 0.5 MG PO TABS: 0.5000 mg | ORAL_TABLET | Freq: Every evening | ORAL | 5 refills | Status: DC | PRN

## 2016-10-26 NOTE — Patient Instructions (Signed)

## 2016-10-26 NOTE — Progress Notes (Signed)
Patient: Denise Gregory, Female    DOB: 04-May-1960, 56 y.o.   MRN: [id number] Visit Date: 10/26/2016  Today's Provider: Margaretann Loveless, PA-C   Chief Complaint  Patient presents with  . Annual Exam  . Anxiety   Subjective:    Annual physical exam Denise Gregory is a 56 y.o. female who presents today for health maintenance and complete physical. She feels well. She reports exercising. She reports she is sleeping well. 10/14/15 CPE 11/06/15 Mammogram-BI-RADS 1 06/09/10 Pap-Hysterectomy 09/24/15 Colonoscopy-polyps, recheck in 3-5 years -----------------------------------------------------------------  Review of Systems  Constitutional: Negative.   HENT: Negative.   Eyes: Negative.   Respiratory: Negative.   Cardiovascular: Negative.   Gastrointestinal: Positive for diarrhea.  Endocrine: Negative.   Genitourinary: Negative.   Musculoskeletal: Negative.   Skin: Negative.   Allergic/Immunologic: Negative.   Neurological: Negative.   Hematological: Negative.   Psychiatric/Behavioral: Positive for sleep disturbance.    Social History      She  reports that she has never smoked. She has never used smokeless tobacco. She reports that she drinks alcohol. She reports that she does not use drugs.       Social History   Social History  . Marital status: Married    Spouse name: N/A  . Number of children: N/A  . Years of education: N/A   Social History Main Topics  . Smoking status: Never Smoker  . Smokeless tobacco: Never Used  . Alcohol use Yes     Comment: 1-2 glasses a week  . Drug use: No  . Sexual activity: Not Asked   Other Topics Concern  . None   Social History Narrative  . None    Past Medical History:  Diagnosis Date  . Anemia   . Anxiety      Patient Active Problem List   Diagnosis Date Noted  . Anxiety 10/09/2014  . History of chicken pox 10/09/2014  . History of ectopic pregnancy 10/09/2014  . Cannot sleep  10/09/2014  . Anemia, iron deficiency 10/09/2014  . Feeling stressed out 10/09/2014  . Avitaminosis D 10/09/2014    Past Surgical History:  Procedure Laterality Date  . Status post hysterectomy  11/19/2009   Due to fibroids; Still has right ovary  . status post left oophorectomy     Due to tubal pregnancy    Family History        Family Status  Relation Status  . Father Deceased at age 69       Died from Lung Cancer  . Mother Alive  . PGM (Not Specified)       senile Osteoporosis  . Neg Hx (Not Specified)        Her family history includes Coronary artery disease in her father; Heart attack in her father; Liver cancer in her father.     Allergies  Allergen Reactions  . Codeine Nausea Only     Current Outpatient Prescriptions:  .  ALPRAZolam (XANAX) 0.5 MG tablet, Take 1 tablet (0.5 mg total) by mouth at bedtime as needed., Disp: 30 tablet, Rfl: 3   Patient Care Team: Margaretann Loveless, PA-C as PCP - General (Physician Assistant)      Objective:   Vitals: BP 130/82 (BP Location: Left Arm, Patient Position: Sitting, Cuff Size: Normal)   Pulse 88   Temp 97.8 F (36.6 C) (Oral)   Resp 16   Ht  (1.575 m)   Wt 125 lb (56.7 kg)  BMI 22.86 kg/m    Vitals:   10/26/16 1545  BP: 130/82  Pulse: 88  Resp: 16  Temp: 97.8 F (36.6 C)  TempSrc: Oral  Weight: 125 lb (56.7 kg)  Height:  (1.575 m)     Physical Exam  Constitutional: She is oriented to person, place, and time. She appears well-developed and well-nourished. No distress.  HENT:  Head: Normocephalic and atraumatic.  Right Ear: Hearing, tympanic membrane, external ear and ear canal normal.  Left Ear: Hearing, tympanic membrane, external ear and ear canal normal.  Nose: Nose normal.  Mouth/Throat: Uvula is midline, oropharynx is clear and moist and mucous membranes are normal. No oropharyngeal exudate.  Eyes: Pupils are equal, round, and reactive to light. Conjunctivae and EOM are normal.  Right eye exhibits no discharge. Left eye exhibits no discharge. No scleral icterus.  Neck: Normal range of motion. Neck supple. No JVD present. Carotid bruit is not present. No tracheal deviation present. No thyromegaly present.  Cardiovascular: Normal rate, regular rhythm, normal heart sounds and intact distal pulses.  Exam reveals no gallop and no friction rub.   No murmur heard. Pulmonary/Chest: Effort normal and breath sounds normal. No respiratory distress. She has no wheezes. She has no rales. She exhibits no tenderness. Right breast exhibits no inverted nipple, no mass, no nipple discharge, no skin change and no tenderness. Left breast exhibits no inverted nipple, no mass, no nipple discharge, no skin change and no tenderness. Breasts are symmetrical.  Abdominal: Soft. Bowel sounds are normal. She exhibits no distension and no mass. There is no tenderness. There is no rebound and no guarding.  Musculoskeletal: Normal range of motion. She exhibits no edema or tenderness.  Lymphadenopathy:    She has no cervical adenopathy.  Neurological: She is alert and oriented to person, place, and time. She has normal reflexes.  Skin: Skin is warm and dry. No rash noted. She is not diaphoretic.  Psychiatric: She has a normal mood and affect. Her behavior is normal. Judgment and thought content normal.  Vitals reviewed.    Depression Screen PHQ 2/9 Scores 10/26/2016 07/29/2016  PHQ - 2 Score 0 0  PHQ- 9 Score 3 -      Assessment & Plan:     Routine Health Maintenance and Physical Exam  Exercise Activities and Dietary recommendations Goals    None      Immunization History  Administered Date(s) Administered  . Influenza,inj,Quad PF,6+ Mos 10/12/2014, 10/14/2015  . Tdap 05/06/2010    Health Maintenance  Topic Date Due  . Hepatitis C Screening  10-24-1960  . HIV Screening  04/12/1975  . PAP SMEAR  09/21/2010  . INFLUENZA VACCINE  08/19/2016  . MAMMOGRAM  11/04/2017  . TETANUS/TDAP   05/05/2020  . COLONOSCOPY  09/23/2020     Discussed health benefits of physical activity, and encouraged her to engage in regular exercise appropriate for her age and condition.    1. Annual physical exam Normal physical exam today. Will check labs as below and f/u pending lab results. If labs are stable and WNL she will not need to have these rechecked for one year at her next annual physical exam. She is to call the office in the meantime if she has any acute issue, questions or concerns. - CBC w/Diff/Platelet - TSH  2. Breast cancer screening Breast exam today was normal. There is no family history of breast cancer. She does perform regular self breast exams. Mammogram was ordered as below. Information for  [facility] was given to patient so she may schedule her mammogram at her convenience. - MM DIGITAL SCREENING BILATERAL; Future  3. Anxiety Will check labs as below and f/u pending results. - TSH  4. Iron deficiency anemia, unspecified iron deficiency anemia type Will check labs as below and f/u pending results. - CBC w/Diff/Platelet  5. Avitaminosis D Will check labs as below and f/u pending results. - Vitamin D (25 hydroxy)  6. Need for hepatitis C screening test - Hepatitis C Antibody  7. Primary insomnia Stable. Diagnosis pulled for medication refill. Continue current medical treatment plan. - ALPRAZolam (XANAX) 0.5 MG tablet; Take 1 tablet (0.5 mg total) by mouth at bedtime as needed.  Dispense: 30 tablet; Refill: 5  8. Need for influenza vaccination Flu vaccine given today without complication. Patient sat upright for 15 minutes to check for adverse reaction before being released. - Flu Vaccine QUAD 36+ mos IM  --------------------------------------------------------------------    Margaretann Loveless, PA-C  [facility]

## 2016-10-27 ENCOUNTER — Telehealth: Payer: Self-pay

## 2016-10-27 LAB — CBC WITH DIFFERENTIAL/PLATELET
BASOS ABS: 0 10*3/uL (ref 0.0–0.2)
BASOS: 0 %
EOS (ABSOLUTE): 0.1 10*3/uL (ref 0.0–0.4)
Eos: 1 %
Hematocrit: 40.4 % (ref 34.0–46.6)
Hemoglobin: 13.6 g/dL (ref 11.1–15.9)
IMMATURE GRANULOCYTES: 0 %
Immature Grans (Abs): 0 10*3/uL (ref 0.0–0.1)
LYMPHS: 34 %
Lymphocytes Absolute: 3.1 10*3/uL (ref 0.7–3.1)
MCH: 31.9 pg (ref 26.6–33.0)
MCHC: 33.7 g/dL (ref 31.5–35.7)
MCV: 95 fL (ref 79–97)
MONOS ABS: 0.6 10*3/uL (ref 0.1–0.9)
Monocytes: 7 %
NEUTROS PCT: 58 %
Neutrophils Absolute: 5.3 10*3/uL (ref 1.4–7.0)
PLATELETS: 290 10*3/uL (ref 150–379)
RBC: 4.27 x10E6/uL (ref 3.77–5.28)
RDW: 13 % (ref 12.3–15.4)
WBC: 9.1 10*3/uL (ref 3.4–10.8)

## 2016-10-27 LAB — TSH: TSH: 2.33 u[IU]/mL (ref 0.450–4.500)

## 2016-10-27 LAB — HEPATITIS C ANTIBODY: Hep C Virus Ab: <0.1 s/co ratio (ref 0.0–0.9)

## 2016-10-27 LAB — VITAMIN D 25 HYDROXY (VIT D DEFICIENCY, FRACTURES): VIT D 25 HYDROXY: 29 ng/mL — AB (ref 30.0–100.0)

## 2016-10-27 NOTE — Telephone Encounter (Signed)
-----   Message from Margaretann Loveless, [place] sent at 10/27/2016  8:29 AM EDT ----- All labs are normal with exception of Vit D which is borderline low at 29. Recommend OTC Vit D supplement of 800-2000IU daily.

## 2016-10-27 NOTE — Telephone Encounter (Signed)
Patient advised as below. Patient verbalizes understanding and is in agreement with treatment plan.  

## 2016-10-29 ENCOUNTER — Encounter: Payer: Self-pay | Admitting: Physician Assistant

## 2016-11-18 ENCOUNTER — Ambulatory Visit
Admission: RE | Admit: 2016-11-18 | Discharge: 2016-11-18 | Disposition: A | Discharge status: Home / Self Care | Payer: 59 | Source: Ambulatory Visit | Attending: Physician Assistant | Admitting: Physician Assistant

## 2016-11-18 DIAGNOSIS — Z1239 Encounter for other screening for malignant neoplasm of breast: Secondary | ICD-10-CM

## 2016-11-18 DIAGNOSIS — Z1231 Encounter for screening mammogram for malignant neoplasm of breast: Secondary | ICD-10-CM | POA: Insufficient documentation

## 2016-11-21 ENCOUNTER — Telehealth: Payer: Self-pay

## 2016-11-21 NOTE — Telephone Encounter (Signed)
lmtcb

## 2016-11-21 NOTE — Telephone Encounter (Signed)
-----   Message from Margaretann LovelessJennifer M Burnette, [place]-C sent at 11/18/2016  6:00 PM EDT ----- Normal mammogram. Repeat screening in one year.

## 2016-11-23 NOTE — Telephone Encounter (Signed)
Pt reviewed in Mychart

## 2017-10-05 ENCOUNTER — Other Ambulatory Visit: Payer: Self-pay | Admitting: Physician Assistant

## 2017-10-05 DIAGNOSIS — Z1231 Encounter for screening mammogram for malignant neoplasm of breast: Secondary | ICD-10-CM

## 2017-10-27 ENCOUNTER — Encounter: Payer: Self-pay | Admitting: Physician Assistant

## 2017-10-27 ENCOUNTER — Ambulatory Visit (INDEPENDENT_AMBULATORY_CARE_PROVIDER_SITE_OTHER): Payer: Managed Care, Other (non HMO) | Admitting: Physician Assistant

## 2017-10-27 VITALS — BP 120/80 | HR 83 | Temp 97.5°F | Resp 16 | Ht 62.0 in | Wt 128.4 lb

## 2017-10-27 DIAGNOSIS — D509 Iron deficiency anemia, unspecified: Secondary | ICD-10-CM

## 2017-10-27 DIAGNOSIS — E559 Vitamin D deficiency, unspecified: Secondary | ICD-10-CM

## 2017-10-27 DIAGNOSIS — F5101 Primary insomnia: Secondary | ICD-10-CM | POA: Diagnosis not present

## 2017-10-27 DIAGNOSIS — Z0000 Encounter for general adult medical examination without abnormal findings: Secondary | ICD-10-CM | POA: Diagnosis not present

## 2017-10-27 DIAGNOSIS — Z23 Encounter for immunization: Secondary | ICD-10-CM

## 2017-10-27 DIAGNOSIS — E7800 Pure hypercholesterolemia, unspecified: Secondary | ICD-10-CM | POA: Insufficient documentation

## 2017-10-27 MED ORDER — ALPRAZOLAM 0.5 MG PO TABS: 0.5000 mg | ORAL_TABLET | Freq: Every evening | ORAL | 1 refills | Status: DC | PRN

## 2017-10-27 MED ORDER — ALPRAZOLAM 0.5 MG PO TABS: 0.5000 mg | ORAL_TABLET | Freq: Every evening | ORAL | 5 refills | Status: DC | PRN

## 2017-10-27 NOTE — Progress Notes (Signed)
Patient: Denise Gregory, Female    DOB: [birth year], 57 y.o.   MRN: [id number] Visit Date: 10/27/2017  Today's Provider: Margaretann Loveless, PA-C   Chief Complaint  Patient presents with  . Annual Exam   Subjective:    Annual physical exam Denise Gregory is a 57 y.o. female who presents today for health maintenance and complete physical. She feels well. She reports exercising some. She reports she is sleeping poorly, uses xanax for sleep prn. Her daughter is pregnant with a son, due in Nov. This is her first grandchild.   -----------------------------------------------------------------   Review of Systems  Constitutional: Negative.   HENT: Negative.   Eyes: Negative.   Respiratory: Negative.   Cardiovascular: Negative.   Gastrointestinal: Negative.   Endocrine: Negative.   Genitourinary: Negative.   Musculoskeletal: Negative.   Skin: Negative.   Allergic/Immunologic: Negative.   Neurological: Negative.   Hematological: Negative.   Psychiatric/Behavioral: Negative.     Social History      She  reports that she has never smoked. She has never used smokeless tobacco. She reports that she drinks alcohol. She reports that she does not use drugs.       Social History   Socioeconomic History  . Marital status: Married    Spouse name: Not on file  . Number of children: Not on file  . Years of education: Not on file  . Highest education level: Not on file  Occupational History  . Not on file  Social Needs  . Financial resource strain: Not on file  . Food insecurity:    Worry: Not on file    Inability: Not on file  . Transportation needs:    Medical: Not on file    Non-medical: Not on file  Tobacco Use  . Smoking status: Never Smoker  . Smokeless tobacco: Never Used  Substance and Sexual Activity  . Alcohol use: Yes    Comment: 1-2 glasses a week  . Drug use: No  . Sexual activity: Not on file  Lifestyle  . Physical activity:    Days  per week: Not on file    Minutes per session: Not on file  . Stress: Not on file  Relationships  . Social connections:    Talks on phone: Not on file    Gets together: Not on file    Attends religious service: Not on file    Active member of club or organization: Not on file    Attends meetings of clubs or organizations: Not on file    Relationship status: Not on file  Other Topics Concern  . Not on file  Social History Narrative  . Not on file    Past Medical History:  Diagnosis Date  . Anemia   . Anxiety      Patient Active Problem List   Diagnosis Date Noted  . Anxiety 10/09/2014  . History of chicken pox 10/09/2014  . History of ectopic pregnancy 10/09/2014  . Cannot sleep 10/09/2014  . Anemia, iron deficiency 10/09/2014  . Feeling stressed out 10/09/2014  . Avitaminosis D 10/09/2014    Past Surgical History:  Procedure Laterality Date  . ABDOMINAL HYSTERECTOMY    . Status post hysterectomy  11/19/2009   Due to fibroids; Still has right ovary  . status post left oophorectomy     Due to tubal pregnancy    Family History        Family Status  Relation Name Status  .  Father  Deceased at age 59       Died from Lung Cancer  . Mother  Alive  . PGM  (Not Specified)       senile Osteoporosis  . Neg Hx  (Not Specified)        Her family history includes Coronary artery disease in her father; Heart attack in her father; Liver cancer in her father. There is no history of Breast cancer.      Allergies  Allergen Reactions  . Codeine Nausea Only     Current Outpatient Medications:  .  ALPRAZolam (XANAX) 0.5 MG tablet, Take 1 tablet (0.5 mg total) by mouth at bedtime as needed., Disp: 30 tablet, Rfl: 5   Patient Care Team: Margaretann Loveless, PA-C as PCP - General (Physician Assistant)      Objective:   Vitals: BP 120/80 (BP Location: Left Arm, Patient Position: Sitting, Cuff Size: Normal)   Pulse 83   Temp (!) 97.5 F (36.4 C) (Oral)   Resp 16    Ht 5\' 2"  (1.575 m)   Wt 128 lb 6.4 oz (58.2 kg)   BMI 23.48 kg/m    Vitals:   10/27/17 1031  BP: 120/80  Pulse: 83  Resp: 16  Temp: (!) 97.5 F (36.4 C)  TempSrc: Oral  Weight: 128 lb 6.4 oz (58.2 kg)  Height: 5\' 2"  (1.575 m)     Physical Exam  Constitutional: She is oriented to person, place, and time. She appears well-developed and well-nourished. No distress.  HENT:  Head: Normocephalic and atraumatic.  Right Ear: Hearing, tympanic membrane, external ear and ear canal normal.  Left Ear: Hearing, tympanic membrane, external ear and ear canal normal.  Nose: Nose normal.  Mouth/Throat: Oropharynx is clear and moist. No oropharyngeal exudate.  Eyes: Pupils are equal, round, and reactive to light. Conjunctivae and EOM are normal. Right eye exhibits no discharge. Left eye exhibits no discharge. No scleral icterus.  Neck: Normal range of motion. Neck supple. No JVD present. No tracheal deviation present. No thyromegaly present.  Cardiovascular: Normal rate, regular rhythm, normal heart sounds and intact distal pulses. Exam reveals no gallop and no friction rub.  No murmur heard. Pulmonary/Chest: Effort normal and breath sounds normal. No respiratory distress. She has no wheezes. She has no rales. She exhibits no tenderness. Right breast exhibits no inverted nipple, no mass, no nipple discharge, no skin change and no tenderness. Left breast exhibits no inverted nipple, no mass, no nipple discharge, no skin change and no tenderness. No breast swelling, tenderness, discharge or bleeding. Breasts are symmetrical.  Abdominal: Soft. Bowel sounds are normal. She exhibits no distension and no mass. There is no tenderness. There is no rebound and no guarding.  Musculoskeletal: Normal range of motion. She exhibits no edema or tenderness.  Lymphadenopathy:    She has no cervical adenopathy.  Neurological: She is alert and oriented to person, place, and time.  Skin: Skin is warm and dry. No rash  noted. She is not diaphoretic.  Psychiatric: She has a normal mood and affect. Her behavior is normal. Judgment and thought content normal.  Vitals reviewed.    Depression Screen PHQ 2/9 Scores 10/27/2017 10/26/2016 07/29/2016  PHQ - 2 Score 0 0 0  PHQ- 9 Score - 3 -      Assessment & Plan:     Routine Health Maintenance and Physical Exam  Exercise Activities and Dietary recommendations Goals   None     Immunization History  Administered  Date(s) Administered  . Influenza,inj,Quad PF,6+ Mos 10/12/2014, 10/14/2015, 10/26/2016  . Tdap 05/06/2010    Health Maintenance  Topic Date Due  . HIV Screening  04/12/1975  . PAP SMEAR  09/21/2010  . INFLUENZA VACCINE  08/19/2017  . MAMMOGRAM  11/18/2017  . TETANUS/TDAP  05/05/2020  . COLONOSCOPY  09/23/2020  . Hepatitis C Screening  Completed     Discussed health benefits of physical activity, and encouraged her to engage in regular exercise appropriate for her age and condition.    1. Annual physical exam Normal physical exam today. Will check labs as below and f/u pending lab results. If labs are stable and WNL she will not need to have these rechecked for one year at her next annual physical exam. She is to call the office in the meantime if she has any acute issue, questions or concerns. - CBC with Differential/Platelet - Comprehensive metabolic panel  2. Primary insomnia Stable. Diagnosis pulled for medication refill. Continue current medical treatment plan. Will check labs as below and f/u pending results. - TSH - ALPRAZolam (XANAX) 0.5 MG tablet; Take 1 tablet (0.5 mg total) by mouth at bedtime as needed.  Dispense: 90 tablet; Refill: 1  3. Avitaminosis D H/O this and postmenopausal. Will check labs as below and f/u pending results. - CBC with Differential/Platelet - Comprehensive metabolic panel - Vitamin D (25 hydroxy)  4. Iron deficiency anemia, unspecified iron deficiency anemia type H/O this. Not on  supplementation. Will check labs as below and f/u pending results. - CBC with Differential/Platelet - Comprehensive metabolic panel - Fe+TIBC+Fer  5. Hypercholesterolemia Elevated on biometric screen. Patient declines statin therapy.   6. Need for influenza vaccination Flu vaccine given today without complication. Patient sat upright for 15 minutes to check for adverse reaction before being released. - Flu Vaccine QUAD 36+ mos IM  --------------------------------------------------------------------    Margaretann Loveless, PA-C  [facility]

## 2017-10-27 NOTE — Patient Instructions (Signed)
Health Maintenance for Postmenopausal Women Menopause is a normal process in which your reproductive ability comes to an end. This process happens gradually over a span of months to years, usually between the ages of 22 and 9. Menopause is complete when you have missed 12 consecutive menstrual periods. It is important to talk with your health care provider about some of the most common conditions that affect postmenopausal women, such as heart disease, cancer, and bone loss (osteoporosis). Adopting a healthy lifestyle and getting preventive care can help to promote your health and wellness. Those actions can also lower your chances of developing some of these common conditions. What should I know about menopause? During menopause, you may experience a number of symptoms, such as:  Moderate-to-severe hot flashes.  Night sweats.  Decrease in sex drive.  Mood swings.  Headaches.  Tiredness.  Irritability.  Memory problems.  Insomnia.  Choosing to treat or not to treat menopausal changes is an individual decision that you make with your health care provider. What should I know about hormone replacement therapy and supplements? Hormone therapy products are effective for treating symptoms that are associated with menopause, such as hot flashes and night sweats. Hormone replacement carries certain risks, especially as you become older. If you are thinking about using estrogen or estrogen with progestin treatments, discuss the benefits and risks with your health care provider. What should I know about heart disease and stroke? Heart disease, heart attack, and stroke become more likely as you age. This may be due, in part, to the hormonal changes that your body experiences during menopause. These can affect how your body processes dietary fats, triglycerides, and cholesterol. Heart attack and stroke are both medical emergencies. There are many things that you can do to help prevent heart disease  and stroke:  Have your blood pressure checked at least every 1-2 years. High blood pressure causes heart disease and increases the risk of stroke.  If you are 53-22 years old, ask your health care provider if you should take aspirin to prevent a heart attack or a stroke.  Do not use any tobacco products, including cigarettes, chewing tobacco, or electronic cigarettes. If you need help quitting, ask your health care provider.  It is important to eat a healthy diet and maintain a healthy weight. ? Be sure to include plenty of vegetables, fruits, low-fat dairy products, and lean protein. ? Avoid eating foods that are high in solid fats, added sugars, or salt (sodium).  Get regular exercise. This is one of the most important things that you can do for your health. ? Try to exercise for at least 150 minutes each week. The type of exercise that you do should increase your heart rate and make you sweat. This is known as moderate-intensity exercise. ? Try to do strengthening exercises at least twice each week. Do these in addition to the moderate-intensity exercise.  Know your numbers.Ask your health care provider to check your cholesterol and your blood glucose. Continue to have your blood tested as directed by your health care provider.  What should I know about cancer screening? There are several types of cancer. Take the following steps to reduce your risk and to catch any cancer development as early as possible. Breast Cancer  Practice breast self-awareness. ? This means understanding how your breasts normally appear and feel. ? It also means doing regular breast self-exams. Let your health care provider know about any changes, no matter how small.  If you are 40  or older, have a clinician do a breast exam (clinical breast exam or CBE) every year. Depending on your age, family history, and medical history, it may be recommended that you also have a yearly breast X-ray (mammogram).  If you  have a family history of breast cancer, talk with your health care provider about genetic screening.  If you are at high risk for breast cancer, talk with your health care provider about having an MRI and a mammogram every year.  Breast cancer (BRCA) gene test is recommended for women who have family members with BRCA-related cancers. Results of the assessment will determine the need for genetic counseling and BRCA1 and for BRCA2 testing. BRCA-related cancers include these types: ? Breast. This occurs in males or females. ? Ovarian. ? Tubal. This may also be called fallopian tube cancer. ? Cancer of the abdominal or pelvic lining (peritoneal cancer). ? Prostate. ? Pancreatic.  Cervical, Uterine, and Ovarian Cancer Your health care provider may recommend that you be screened regularly for cancer of the pelvic organs. These include your ovaries, uterus, and vagina. This screening involves a pelvic exam, which includes checking for microscopic changes to the surface of your cervix (Pap test).  For women ages 21-65, health care providers may recommend a pelvic exam and a Pap test every three years. For women ages 79-65, they may recommend the Pap test and pelvic exam, combined with testing for human papilloma virus (HPV), every five years. Some types of HPV increase your risk of cervical cancer. Testing for HPV may also be done on women of any age who have unclear Pap test results.  Other health care providers may not recommend any screening for nonpregnant women who are considered low risk for pelvic cancer and have no symptoms. Ask your health care provider if a screening pelvic exam is right for you.  If you have had past treatment for cervical cancer or a condition that could lead to cancer, you need Pap tests and screening for cancer for at least 20 years after your treatment. If Pap tests have been discontinued for you, your risk factors (such as having a new sexual partner) need to be  reassessed to determine if you should start having screenings again. Some women have medical problems that increase the chance of getting cervical cancer. In these cases, your health care provider may recommend that you have screening and Pap tests more often.  If you have a family history of uterine cancer or ovarian cancer, talk with your health care provider about genetic screening.  If you have vaginal bleeding after reaching menopause, tell your health care provider.  There are currently no reliable tests available to screen for ovarian cancer.  Lung Cancer Lung cancer screening is recommended for adults 69-62 years old who are at high risk for lung cancer because of a history of smoking. A yearly low-dose CT scan of the lungs is recommended if you:  Currently smoke.  Have a history of at least 30 pack-years of smoking and you currently smoke or have quit within the past 15 years. A pack-year is smoking an average of one pack of cigarettes per day for one year.  Yearly screening should:  Continue until it has been 15 years since you quit.  Stop if you develop a health problem that would prevent you from having lung cancer treatment.  Colorectal Cancer  This type of cancer can be detected and can often be prevented.  Routine colorectal cancer screening usually begins at  age 72 and continues through age 20.  If you have risk factors for colon cancer, your health care provider may recommend that you be screened at an earlier age.  If you have a family history of colorectal cancer, talk with your health care provider about genetic screening.  Your health care provider may also recommend using home test kits to check for hidden blood in your stool.  A small camera at the end of a tube can be used to examine your colon directly (sigmoidoscopy or colonoscopy). This is done to check for the earliest forms of colorectal cancer.  Direct examination of the colon should be repeated every  5-10 years until age 29. However, if early forms of precancerous polyps or small growths are found or if you have a family history or genetic risk for colorectal cancer, you may need to be screened more often.  Skin Cancer  Check your skin from head to toe regularly.  Monitor any moles. Be sure to tell your health care provider: ? About any new moles or changes in moles, especially if there is a change in a mole's shape or color. ? If you have a mole that is larger than the size of a pencil eraser.  If any of your family members has a history of skin cancer, especially at a young age, talk with your health care provider about genetic screening.  Always use sunscreen. Apply sunscreen liberally and repeatedly throughout the day.  Whenever you are outside, protect yourself by wearing long sleeves, pants, a wide-brimmed hat, and sunglasses.  What should I know about osteoporosis? Osteoporosis is a condition in which bone destruction happens more quickly than new bone creation. After menopause, you may be at an increased risk for osteoporosis. To help prevent osteoporosis or the bone fractures that can happen because of osteoporosis, the following is recommended:  If you are 63-20 years old, get at least 1,000 mg of calcium and at least 600 mg of vitamin D per day.  If you are older than age 85 but younger than age 70, get at least 1,200 mg of calcium and at least 600 mg of vitamin D per day.  If you are older than age 71, get at least 1,200 mg of calcium and at least 800 mg of vitamin D per day.  Smoking and excessive alcohol intake increase the risk of osteoporosis. Eat foods that are rich in calcium and vitamin D, and do weight-bearing exercises several times each week as directed by your health care provider. What should I know about how menopause affects my mental health? Depression may occur at any age, but it is more common as you become older. Common symptoms of depression  include:  Low or sad mood.  Changes in sleep patterns.  Changes in appetite or eating patterns.  Feeling an overall lack of motivation or enjoyment of activities that you previously enjoyed.  Frequent crying spells.  Talk with your health care provider if you think that you are experiencing depression. What should I know about immunizations? It is important that you get and maintain your immunizations. These include:  Tetanus, diphtheria, and pertussis (Tdap) booster vaccine.  Influenza every year before the flu season begins.  Pneumonia vaccine.  Shingles vaccine.  Your health care provider may also recommend other immunizations. This information is not intended to replace advice given to you by your health care provider. Make sure you discuss any questions you have with your health care provider. Document Released: 02/27/2005  Document Revised: 07/26/2015 Document Reviewed: 10/09/2014 Elsevier Interactive Patient Education  [employer].

## 2017-10-28 LAB — COMPREHENSIVE METABOLIC PANEL
A/G RATIO: 2.3 — AB (ref 1.2–2.2)
ALBUMIN: 4.9 g/dL (ref 3.5–5.5)
ALK PHOS: 59 IU/L (ref 39–117)
ALT: 43 IU/L — ABNORMAL HIGH (ref 0–32)
AST: 33 IU/L (ref 0–40)
BILIRUBIN TOTAL: 0.3 mg/dL (ref 0.0–1.2)
BUN/Creatinine Ratio: 20 (ref 9–23)
BUN: 11 mg/dL (ref 6–24)
CO2: 22 mmol/L (ref 20–29)
Calcium: 10 mg/dL (ref 8.7–10.2)
Chloride: 101 mmol/L (ref 96–106)
Creatinine, Ser: 0.56 mg/dL — ABNORMAL LOW (ref 0.57–1.00)
GFR calc non Af Amer: 104 mL/min/{1.73_m2} (ref >59)
GFR, EST AFRICAN AMERICAN: 120 mL/min/{1.73_m2} (ref >59)
GLOBULIN, TOTAL: 2.1 g/dL (ref 1.5–4.5)
GLUCOSE: 81 mg/dL (ref 65–99)
POTASSIUM: 4.1 mmol/L (ref 3.5–5.2)
Sodium: 144 mmol/L (ref 134–144)
TOTAL PROTEIN: 7 g/dL (ref 6.0–8.5)

## 2017-10-28 LAB — IRON,TIBC AND FERRITIN PANEL
FERRITIN: 166 ng/mL — AB (ref 15–150)
IRON SATURATION: 34 % (ref 15–55)
Iron: 89 ug/dL (ref 27–159)
TIBC: 262 ug/dL (ref 250–450)
UIBC: 173 ug/dL (ref 131–425)

## 2017-10-28 LAB — VITAMIN D 25 HYDROXY (VIT D DEFICIENCY, FRACTURES): VIT D 25 HYDROXY: 30.8 ng/mL (ref 30.0–100.0)

## 2017-10-28 LAB — CBC WITH DIFFERENTIAL/PLATELET
BASOS ABS: 0 10*3/uL (ref 0.0–0.2)
Basos: 0 %
EOS (ABSOLUTE): 0.1 10*3/uL (ref 0.0–0.4)
Eos: 1 %
Hematocrit: 39.4 % (ref 34.0–46.6)
Hemoglobin: 13.4 g/dL (ref 11.1–15.9)
Immature Grans (Abs): 0 10*3/uL (ref 0.0–0.1)
Immature Granulocytes: 0 %
LYMPHS ABS: 2.3 10*3/uL (ref 0.7–3.1)
Lymphs: 24 %
MCH: 31.6 pg (ref 26.6–33.0)
MCHC: 34 g/dL (ref 31.5–35.7)
MCV: 93 fL (ref 79–97)
Monocytes Absolute: 0.9 10*3/uL (ref 0.1–0.9)
Monocytes: 9 %
NEUTROS ABS: 6.4 10*3/uL (ref 1.4–7.0)
Neutrophils: 66 %
PLATELETS: 284 10*3/uL (ref 150–450)
RBC: 4.24 x10E6/uL (ref 3.77–5.28)
RDW: 12 % — ABNORMAL LOW (ref 12.3–15.4)
WBC: 9.7 10*3/uL (ref 3.4–10.8)

## 2017-10-28 LAB — TSH: TSH: 1.3 u[IU]/mL (ref 0.450–4.500)

## 2017-11-19 ENCOUNTER — Ambulatory Visit
Admission: RE | Admit: 2017-11-19 | Discharge: 2017-11-19 | Disposition: A | Discharge status: Home / Self Care | Payer: Managed Care, Other (non HMO) | Source: Ambulatory Visit | Attending: Physician Assistant | Admitting: Physician Assistant

## 2017-11-19 DIAGNOSIS — Z1231 Encounter for screening mammogram for malignant neoplasm of breast: Secondary | ICD-10-CM | POA: Insufficient documentation

## 2018-02-21 ENCOUNTER — Other Ambulatory Visit: Payer: Self-pay | Admitting: Physician Assistant

## 2018-02-21 DIAGNOSIS — F5101 Primary insomnia: Secondary | ICD-10-CM

## 2018-09-14 ENCOUNTER — Other Ambulatory Visit: Payer: Self-pay | Admitting: Physician Assistant

## 2018-09-14 DIAGNOSIS — F5101 Primary insomnia: Secondary | ICD-10-CM

## 2018-10-20 ENCOUNTER — Other Ambulatory Visit: Payer: Self-pay | Admitting: Physician Assistant

## 2018-10-20 DIAGNOSIS — Z1231 Encounter for screening mammogram for malignant neoplasm of breast: Secondary | ICD-10-CM

## 2018-11-11 NOTE — Progress Notes (Signed)
Patient: Denise Gregory, Female    DOB: [birth year], 58 y.o.   MRN: [id number] Visit Date: 11/14/2018  Today's Provider: Margaretann Loveless, PA-C   Chief Complaint  Patient presents with  . Annual Exam   Subjective:     Annual physical exam Denise Gregory is a 58 y.o. female who presents today for health maintenance and complete physical. She feels well. She reports exercising no. She reports she is sleeping well.  09/21/2007-Pap-normal; s/p hysterectomy  Mammogram Scheduled 11/28/2018.  Colonoscopy:09/24/15 polyps, recheck in 3-5 yrs.   Review of Systems  Constitutional: Negative.   HENT: Negative.   Eyes: Negative.   Respiratory: Negative.   Cardiovascular: Negative.   Gastrointestinal: Negative.   Endocrine: Negative.   Genitourinary: Negative.   Musculoskeletal: Negative.   Skin: Negative.   Allergic/Immunologic: Negative.   Neurological: Negative.   Hematological: Negative.   Psychiatric/Behavioral: Negative.     Social History      She  reports that she has never smoked. She has never used smokeless tobacco. She reports current alcohol use. She reports that she does not use drugs.       Social History   Socioeconomic History  . Marital status: Married    Spouse name: Not on file  . Number of children: Not on file  . Years of education: Not on file  . Highest education level: Not on file  Occupational History  . Not on file  Social Needs  . Financial resource strain: Not on file  . Food insecurity    Worry: Not on file    Inability: Not on file  . Transportation needs    Medical: Not on file    Non-medical: Not on file  Tobacco Use  . Smoking status: Never Smoker  . Smokeless tobacco: Never Used  Substance and Sexual Activity  . Alcohol use: Yes    Comment: 1-2 glasses a week  . Drug use: No  . Sexual activity: Not on file  Lifestyle  . Physical activity    Days per week: Not on file    Minutes per session: Not on file   . Stress: Not on file  Relationships  . Social Musician on phone: Not on file    Gets together: Not on file    Attends religious service: Not on file    Active member of club or organization: Not on file    Attends meetings of clubs or organizations: Not on file    Relationship status: Not on file  Other Topics Concern  . Not on file  Social History Narrative  . Not on file    Past Medical History:  Diagnosis Date  . Anemia   . Anxiety      Patient Active Problem List   Diagnosis Date Noted  . Hypercholesterolemia 10/27/2017  . Anxiety 10/09/2014  . History of chicken pox 10/09/2014  . History of ectopic pregnancy 10/09/2014  . Cannot sleep 10/09/2014  . Anemia, iron deficiency 10/09/2014  . Feeling stressed out 10/09/2014  . Avitaminosis D 10/09/2014    Past Surgical History:  Procedure Laterality Date  . ABDOMINAL HYSTERECTOMY    . Status post hysterectomy  11/19/2009   Due to fibroids; Still has right ovary  . status post left oophorectomy     Due to tubal pregnancy    Family History        Family Status  Relation Name Status  . Father  Deceased at age 16       Died from [place]  . Mother  Alive  . PGM  (Not Specified)       senile Osteoporosis  . Neg Hx  (Not Specified)        Her family history includes Coronary artery disease in her father; Heart attack in her father; Liver cancer in her father. There is no history of Breast cancer.      Allergies  Allergen Reactions  . Codeine Nausea Only     Current Outpatient Medications:  .  ALPRAZolam (XANAX) 0.5 MG tablet, TAKE 1 TABLET BY MOUTH EVERY NIGHT AT BEDTIME AS NEEDED, Disp: 30 tablet, Rfl: 5   Patient Care Team: Rubye Beach as PCP - General (Physician Assistant)    Objective:    Vitals: BP 128/80 (BP Location: Left Arm, Patient Position: Sitting, Cuff Size: Normal)   Pulse 85   Temp (!) 96.9 F (36.1 C) (Temporal)   Resp 16   Ht 5\' 2"  (1.575 m)   Wt  130 lb 3.2 oz (59.1 kg)   BMI 23.81 kg/m    Vitals:   11/14/18 0955  BP: 128/80  Pulse: 85  Resp: 16  Temp: (!) 96.9 F (36.1 C)  TempSrc: Temporal  Weight: 130 lb 3.2 oz (59.1 kg)  Height: 5\' 2"  (1.575 m)     Physical Exam Vitals signs reviewed.  Constitutional:      General: She is not in acute distress.    Appearance: Normal appearance. She is well-developed and normal weight. She is not ill-appearing or diaphoretic.  HENT:     Head: Normocephalic and atraumatic.     Right Ear: Tympanic membrane, ear canal and external ear normal.     Left Ear: Tympanic membrane, ear canal and external ear normal.     Nose: Nose normal.     Mouth/Throat:     Mouth: Mucous membranes are moist.     Pharynx: Oropharynx is clear. No oropharyngeal exudate.  Eyes:     General: No scleral icterus.       Right eye: No discharge.        Left eye: No discharge.     Extraocular Movements: Extraocular movements intact.     Conjunctiva/sclera: Conjunctivae normal.     Pupils: Pupils are equal, round, and reactive to light.  Neck:     Musculoskeletal: Normal range of motion and neck supple.     Thyroid: No thyromegaly.     Vascular: No carotid bruit or JVD.     Trachea: No tracheal deviation.  Cardiovascular:     Rate and Rhythm: Normal rate and regular rhythm.     Pulses: Normal pulses.     Heart sounds: Normal heart sounds. No murmur. No friction rub. No gallop.   Pulmonary:     Effort: Pulmonary effort is normal. No respiratory distress.     Breath sounds: Normal breath sounds. No wheezing or rales.  Chest:     Chest wall: No tenderness.  Abdominal:     General: Abdomen is flat. Bowel sounds are normal. There is no distension.     Palpations: Abdomen is soft. There is no mass.     Tenderness: There is no abdominal tenderness. There is no guarding or rebound.  Musculoskeletal: Normal range of motion.        General: No tenderness.     Right lower leg: No edema.     Left lower leg: No  edema.  Lymphadenopathy:     Cervical: No cervical adenopathy.  Skin:    General: Skin is warm and dry.     Capillary Refill: Capillary refill takes less than 2 seconds.     Findings: No rash.  Neurological:     General: No focal deficit present.     Mental Status: She is alert and oriented to person, place, and time. Mental status is at baseline.  Psychiatric:        Mood and Affect: Mood normal.        Behavior: Behavior normal.        Thought Content: Thought content normal.        Judgment: Judgment normal.      Depression Screen PHQ 2/9 Scores 11/14/2018 10/27/2017 10/26/2016 07/29/2016  PHQ - 2 Score 0 0 0 0  PHQ- 9 Score 1 - 3 -       Assessment & Plan:     Routine Health Maintenance and Physical Exam  Exercise Activities and Dietary recommendations Goals   None     Immunization History  Administered Date(s) Administered  . Influenza,inj,Quad PF,6+ Mos 10/12/2014, 10/14/2015, 10/26/2016, 10/27/2017  . Tdap 05/06/2010    Health Maintenance  Topic Date Due  . HIV Screening  04/12/1975  . PAP SMEAR-Modifier  09/21/2010  . INFLUENZA VACCINE  08/20/2018  . MAMMOGRAM  11/20/2018  . TETANUS/TDAP  05/05/2020  . COLONOSCOPY  09/23/2020  . Hepatitis C Screening  Completed     Discussed health benefits of physical activity, and encouraged her to engage in regular exercise appropriate for her age and condition.    1. Annual physical exam Normal physical exam today. Will check labs as below and f/u pending lab results. If labs are stable and WNL she will not need to have these rechecked for one year at her next annual physical exam. She is to call the office in the meantime if she has any acute issue, questions or concerns. - Comprehensive metabolic panel - TSH - HgB A1c  2. Avitaminosis D Postmenopausal and history of this. Will check labs as below and f/u pending results. - VITAMIN D 25 Hydroxy (Vit-D Deficiency, Fractures)  3. Iron deficiency anemia,  unspecified iron deficiency anemia type Will check labs as below and f/u pending results. - CBC with Differential/Platelet - TSH  4. Hypercholesterolemia Diet controlled. Strong family history of high cholesterol. Will check labs as below and f/u pending results. - Comprehensive metabolic panel - Lipid Panel With LDL/HDL Ratio - TSH - HgB [id number]  5. Need for influenza vaccination Flu vaccine given today without complication. Patient sat upright for 15 minutes to check for adverse reaction before being released. - Flu Vaccine QUAD 36+ mos IM  6. Need for shingles vaccine Shingrix #1 Vaccine given to patient without complications. Patient sat for 15 minutes after administration and was tolerated well without adverse effects. Return in 2 months for #2.  - Varicella-zoster vaccine IM  7. Screening for HIV without presence of risk factors Will check labs as below and f/u pending results. - HIV antibody (with reflex)  8. Weight gain Patient reports 10-20 pound weight gain over last few years. Tries to eat low carb and does not feel she overeats. Working from home for 2 months during covid she realized she may have snacked more. Has been trying to lose weight with exercise and diet control (not doing a food diary currently). Discussed starting food diary, like myfitnesspal. Also discussed medical nutrition evaluation which is what she  desires and agrees to. Will place referral below.  - Amb ref to Medical Nutrition Therapy-MNT  --------------------------------------------------------------------    Margaretann Loveless, PA-C  [facility]

## 2018-11-14 ENCOUNTER — Other Ambulatory Visit: Payer: Self-pay

## 2018-11-14 ENCOUNTER — Encounter: Payer: Self-pay | Admitting: Physician Assistant

## 2018-11-14 ENCOUNTER — Ambulatory Visit (INDEPENDENT_AMBULATORY_CARE_PROVIDER_SITE_OTHER): Payer: Managed Care, Other (non HMO) | Admitting: Physician Assistant

## 2018-11-14 ENCOUNTER — Other Ambulatory Visit: Payer: Self-pay | Admitting: Physician Assistant

## 2018-11-14 VITALS — BP 128/80 | HR 85 | Temp 96.9°F | Resp 16 | Ht 62.0 in | Wt 130.2 lb

## 2018-11-14 DIAGNOSIS — Z23 Encounter for immunization: Secondary | ICD-10-CM

## 2018-11-14 DIAGNOSIS — E7800 Pure hypercholesterolemia, unspecified: Secondary | ICD-10-CM | POA: Diagnosis not present

## 2018-11-14 DIAGNOSIS — Z0000 Encounter for general adult medical examination without abnormal findings: Secondary | ICD-10-CM | POA: Diagnosis not present

## 2018-11-14 DIAGNOSIS — E559 Vitamin D deficiency, unspecified: Secondary | ICD-10-CM | POA: Diagnosis not present

## 2018-11-14 DIAGNOSIS — Z114 Encounter for screening for human immunodeficiency virus [HIV]: Secondary | ICD-10-CM

## 2018-11-14 DIAGNOSIS — D509 Iron deficiency anemia, unspecified: Secondary | ICD-10-CM

## 2018-11-14 DIAGNOSIS — R635 Abnormal weight gain: Secondary | ICD-10-CM

## 2018-11-14 NOTE — Patient Instructions (Signed)

## 2018-11-15 ENCOUNTER — Telehealth: Payer: Self-pay

## 2018-11-15 LAB — COMPREHENSIVE METABOLIC PANEL
ALT: 31 IU/L (ref 0–32)
AST: 28 IU/L (ref 0–40)
Albumin/Globulin Ratio: 2.1 (ref 1.2–2.2)
Albumin: 4.4 g/dL (ref 3.8–4.9)
Alkaline Phosphatase: 61 IU/L (ref 39–117)
BUN/Creatinine Ratio: 14 (ref 9–23)
BUN: 7 mg/dL (ref 6–24)
Bilirubin Total: 0.3 mg/dL (ref 0.0–1.2)
CO2: 22 mmol/L (ref 20–29)
Calcium: 9.7 mg/dL (ref 8.7–10.2)
Chloride: 102 mmol/L (ref 96–106)
Creatinine, Ser: 0.5 mg/dL — ABNORMAL LOW (ref 0.57–1.00)
GFR calc Af Amer: 123 mL/min/{1.73_m2} (ref >59)
GFR calc non Af Amer: 107 mL/min/{1.73_m2} (ref >59)
Globulin, Total: 2.1 g/dL (ref 1.5–4.5)
Glucose: 89 mg/dL (ref 65–99)
Potassium: 3.9 mmol/L (ref 3.5–5.2)
Sodium: 141 mmol/L (ref 134–144)
Total Protein: 6.5 g/dL (ref 6.0–8.5)

## 2018-11-15 LAB — CBC WITH DIFFERENTIAL/PLATELET
Basophils Absolute: 0 10*3/uL (ref 0.0–0.2)
Basos: 0 %
EOS (ABSOLUTE): 0.1 10*3/uL (ref 0.0–0.4)
Eos: 1 %
Hematocrit: 37.5 % (ref 34.0–46.6)
Hemoglobin: 13.1 g/dL (ref 11.1–15.9)
Immature Grans (Abs): 0 10*3/uL (ref 0.0–0.1)
Immature Granulocytes: 0 %
Lymphocytes Absolute: 2.4 10*3/uL (ref 0.7–3.1)
Lymphs: 26 %
MCH: 32.7 pg (ref 26.6–33.0)
MCHC: 34.9 g/dL (ref 31.5–35.7)
MCV: 94 fL (ref 79–97)
Monocytes Absolute: 0.8 10*3/uL (ref 0.1–0.9)
Monocytes: 9 %
Neutrophils Absolute: 5.8 10*3/uL (ref 1.4–7.0)
Neutrophils: 64 %
Platelets: 304 10*3/uL (ref 150–450)
RBC: 4.01 x10E6/uL (ref 3.77–5.28)
RDW: 11.4 % — ABNORMAL LOW (ref 11.7–15.4)
WBC: 9.1 10*3/uL (ref 3.4–10.8)

## 2018-11-15 LAB — LIPID PANEL WITH LDL/HDL RATIO
Cholesterol, Total: 257 mg/dL — ABNORMAL HIGH (ref 100–199)
HDL: 75 mg/dL (ref >39)
LDL Chol Calc (NIH): 164 mg/dL — ABNORMAL HIGH (ref 0–99)
LDL/HDL Ratio: 2.2 ratio (ref 0.0–3.2)
Triglycerides: 103 mg/dL (ref 0–149)
VLDL Cholesterol Cal: 18 mg/dL (ref 5–40)

## 2018-11-15 LAB — HEMOGLOBIN A1C
Est. average glucose Bld gHb Est-mCnc: 103 mg/dL
Hgb A1c MFr Bld: 5.2 % (ref 4.8–5.6)

## 2018-11-15 LAB — TSH: TSH: 2.28 u[IU]/mL (ref 0.450–4.500)

## 2018-11-15 LAB — VITAMIN D 25 HYDROXY (VIT D DEFICIENCY, FRACTURES): Vit D, 25-Hydroxy: 21.6 ng/mL — ABNORMAL LOW (ref 30.0–100.0)

## 2018-11-15 LAB — HIV ANTIBODY (ROUTINE TESTING W REFLEX): HIV Screen 4th Generation wRfx: NONREACTIVE

## 2018-11-15 NOTE — Telephone Encounter (Signed)
-----   Message from Mar Daring, [place] sent at 11/15/2018  1:35 PM EDT ----- Blood count is normal. Kidney and liver function are normal. Sodium, potassium and calcium are normal. Cholesterol remains elevated but has improved compared to previous labs. Your previous total cholesterol was 274 and it is now 257. LDL was 166 and is now 164. A1c/sugar are normal. Thyroid is normal. Vit D is low at 21.6. Recommend OTC Vit D supplement of 2000 IU daily. HIV screen done once in a lifetime, unless exposed, is negative.

## 2018-11-15 NOTE — Telephone Encounter (Signed)
Viewed by Rhona Raider on 11/15/2018 1:36 PM

## 2018-11-28 ENCOUNTER — Ambulatory Visit
Admission: RE | Admit: 2018-11-28 | Discharge: 2018-11-28 | Disposition: A | Discharge status: Home / Self Care | Payer: Managed Care, Other (non HMO) | Source: Ambulatory Visit | Attending: Physician Assistant | Admitting: Physician Assistant

## 2018-11-28 ENCOUNTER — Telehealth: Payer: Self-pay

## 2018-11-28 DIAGNOSIS — Z1231 Encounter for screening mammogram for malignant neoplasm of breast: Secondary | ICD-10-CM | POA: Diagnosis not present

## 2018-11-28 NOTE — Telephone Encounter (Signed)
Patient advised as directed below. 

## 2018-11-28 NOTE — Telephone Encounter (Signed)
-----   Message from Mar Daring, [place] sent at 11/28/2018  4:28 PM EST ----- Normal mammogram. Repeat screening in one year.

## 2019-01-18 ENCOUNTER — Other Ambulatory Visit: Payer: Self-pay

## 2019-01-18 ENCOUNTER — Ambulatory Visit (INDEPENDENT_AMBULATORY_CARE_PROVIDER_SITE_OTHER): Payer: Managed Care, Other (non HMO) | Admitting: Physician Assistant

## 2019-01-18 DIAGNOSIS — Z23 Encounter for immunization: Secondary | ICD-10-CM | POA: Diagnosis not present

## 2019-01-21 ENCOUNTER — Encounter: Payer: Self-pay | Admitting: Physician Assistant

## 2019-03-24 ENCOUNTER — Ambulatory Visit: Payer: Managed Care, Other (non HMO) | Attending: Internal Medicine

## 2019-03-24 DIAGNOSIS — Z23 Encounter for immunization: Secondary | ICD-10-CM | POA: Insufficient documentation

## 2019-03-24 NOTE — Progress Notes (Signed)
   Covid-19 Vaccination Clinic  Name:  Denise Gregory    MRN: [id number] DOB: October 16, 1960  03/24/2019  [name] was observed post Covid-19 immunization for 15 minutes without incident. She was provided with Vaccine Information Sheet and instruction to access the V-Safe system.   [name] was instructed to call 911 with any severe reactions post vaccine: Marland Kitchen Difficulty breathing  . Swelling of face and throat  . A fast heartbeat  . A bad rash all over body  . Dizziness and weakness   Immunizations Administered    Name Date Dose VIS Date Route   Pfizer COVID-19 Vaccine 03/24/2019  8:17 AM 0.3 mL 12/30/2018 Intramuscular   Manufacturer: [employer], [employer]   Lot: [id number]   NDC: [address]-4

## 2019-04-14 ENCOUNTER — Ambulatory Visit: Payer: Managed Care, Other (non HMO) | Attending: Internal Medicine

## 2019-04-14 DIAGNOSIS — Z23 Encounter for immunization: Secondary | ICD-10-CM

## 2019-04-14 NOTE — Progress Notes (Signed)
   Covid-19 Vaccination Clinic  Name:  KRISTIAN HAZZARD    MRN: [id number] DOB: Apr 22, 1960  04/14/2019  Ms. Ruppel was observed post Covid-19 immunization for 15 minutes without incident. She was provided with Vaccine Information Sheet and instruction to access the V-Safe system.   Ms. Pareja was instructed to call 911 with any severe reactions post vaccine: Marland Kitchen Difficulty breathing  . Swelling of face and throat  . A fast heartbeat  . A bad rash all over body  . Dizziness and weakness   Immunizations Administered    Name Date Dose VIS Date Route   Pfizer COVID-19 Vaccine 04/14/2019  8:05 AM 0.3 mL 12/30/2018 Intramuscular   Manufacturer: [employer], [employer]   Lot: [id number]   NDC: [address]-3

## 2019-06-23 ENCOUNTER — Encounter: Payer: Self-pay | Admitting: Physician Assistant

## 2019-06-23 DIAGNOSIS — F5101 Primary insomnia: Secondary | ICD-10-CM

## 2019-06-23 MED ORDER — ZOLPIDEM TARTRATE 5 MG PO TABS: 5.0000 mg | ORAL_TABLET | Freq: Every evening | ORAL | 1 refills | Status: DC | PRN

## 2019-07-05 ENCOUNTER — Encounter: Payer: Self-pay | Admitting: Physician Assistant

## 2019-07-05 DIAGNOSIS — F5101 Primary insomnia: Secondary | ICD-10-CM

## 2019-07-06 MED ORDER — ESZOPICLONE 3 MG PO TABS: 3.0000 mg | ORAL_TABLET | Freq: Every day | ORAL | 0 refills | Status: DC

## 2019-07-31 ENCOUNTER — Other Ambulatory Visit: Payer: Self-pay | Admitting: Physician Assistant

## 2019-07-31 DIAGNOSIS — F5101 Primary insomnia: Secondary | ICD-10-CM

## 2019-07-31 NOTE — Telephone Encounter (Signed)
Requested medication (s) are due for refill today: no  Requested medication (s) are on the active medication list:  yes  Last refill: 07/06/2019  Future visit scheduled: no  Notes to clinic:  this refill cannot be delegated    Requested Prescriptions  Pending Prescriptions Disp Refills   Eszopiclone 3 MG TABS [Pharmacy Med Name: ESZOPICLONE 3MG  TABLETS] 30 tablet     Sig: TAKE 1 TABLET BY MOUTH EVERY NIGHT AT BEDTIME. TAKE IMMEDIATELY BEFORE BEDTIME      Not Delegated - Psychiatry:  Anxiolytics/Hypnotics Failed - 07/31/2019 12:25 PM      Failed - This refill cannot be delegated      Failed - Urine Drug Screen completed in last 360 days.      Failed - Valid encounter within last 6 months    Recent Outpatient Visits           8 months ago Annual physical exam   [facility] [place], [place], Alessandra Bevels   1 year ago Annual physical exam   [facility] [place], [place], Alessandra Bevels   2 years ago Annual physical exam   [facility] [place], M   3 years ago Diarrhea, unspecified type   [facility], [facility], Alessandra Bevels   3 years ago Annual physical exam   [facility], Margaretann Loveless

## 2019-09-29 ENCOUNTER — Encounter: Payer: Self-pay | Admitting: Physician Assistant

## 2019-10-04 ENCOUNTER — Ambulatory Visit: Payer: Managed Care, Other (non HMO) | Admitting: Physician Assistant

## 2019-10-09 ENCOUNTER — Encounter: Payer: Self-pay | Admitting: Physician Assistant

## 2019-10-09 ENCOUNTER — Other Ambulatory Visit: Payer: Self-pay | Admitting: Physician Assistant

## 2019-10-09 ENCOUNTER — Ambulatory Visit (INDEPENDENT_AMBULATORY_CARE_PROVIDER_SITE_OTHER): Payer: Managed Care, Other (non HMO) | Admitting: Physician Assistant

## 2019-10-09 ENCOUNTER — Other Ambulatory Visit: Payer: Self-pay

## 2019-10-09 VITALS — BP 116/71 | HR 102 | Temp 97.6°F | Resp 16 | Wt 131.2 lb

## 2019-10-09 DIAGNOSIS — S46811A Strain of other muscles, fascia and tendons at shoulder and upper arm level, right arm, initial encounter: Secondary | ICD-10-CM | POA: Diagnosis not present

## 2019-10-09 DIAGNOSIS — Z23 Encounter for immunization: Secondary | ICD-10-CM

## 2019-10-09 MED ORDER — MELOXICAM 15 MG PO TABS: 15.0000 mg | ORAL_TABLET | Freq: Every day | ORAL | 1 refills | Status: DC

## 2019-10-09 NOTE — Patient Instructions (Signed)
Subscapularis Tendon Injury  A subscapularis tendon injury is a tear in the strong cord of tissue (tendon) that connects one of the shoulder muscles (subscapularis muscle) to the top of the upper arm bone (humerus). The subscapularis muscle starts at the inside of the shoulder blade (scapula) and attaches to the humerus. The subscapularis muscle is one of four muscles that make up the rotator cuff in the shoulder. It helps to rotate the shoulder inward and stabilize the shoulder. This injury is sometimes called a rotator cuff tear. Subscapularis tears can be partial or complete tears. These injuries cause shoulder pain and weakness, and, in severe cases, shoulder instability. Subscapularis tears may also involve tearing of a different muscle in the rotator cuff, injury to the biceps tendon, or both. What are the causes? Subscapularis tears are commonly caused by gradual wear and tear from overuse. Gradual wear and tear can result from:  Participating in sports or activities that involve overhead arm movements.  Aging. Subscapularis tears can also be caused by a sudden (acute) injury, which can result from:  Falling on an outstretched arm.  Forcefully pulling or thrusting your arm upward, especially with unexpected resistance.  The shoulder joint moving out of place (dislocation). What increases the risk? You are more likely to develop this condition if you:  Are female, especially if you are age 59 or older.  Play baseball, especially pitching.  Play tennis.  Participate in rowing activities.  Lift weights.  Are a Education administrator or a Music therapist. What are the signs or symptoms? The main symptom of this condition is shoulder pain. If your condition is caused by an acute injury, pain may be sudden and severe. If your condition is caused by overuse, pain may get worse with activity and get better with rest. Other signs and symptoms may include:  Pain with shoulder movement.  Pain at night when  lying on your arm or when your arm is unsupported.  Stiffness and limited range of motion.  Weakness. How is this diagnosed? This condition is diagnosed based on:  Your symptoms.  Your medical history.  A physical exam. Your health care provider may check your shoulder strength and range of motion. This may include having you put your hand behind your back and try to move your hand away from your back (lift-off test).  Imaging tests, such as an MRI or a CT scan. How is this treated? Treatment for this condition may include:  Resting and avoiding activities that cause shoulder pain.  Taking NSAIDs, such as ibuprofen, to help reduce pain and swelling.  Doing physical therapy to improve your strength and range of motion.  Having one or more shots (injections) of medicines to numb the area and reduce inflammation (steroids).  Having surgery to repair the tendon. This may be done if: ? Nonsurgical treatments have not helped. ? You have had a recent acute injury. ? You have a large tear. ? You have a lot of shoulder weakness. ? You are an active person or athlete who needs complete shoulder function. After surgery, treatment includes keeping your arm still while it heals (immobilization) and physical therapy. Follow these instructions at home: Medicines  Take over-the-counter and prescription medicines only as told by your health care provider.  Ask your health care provider if the medicine prescribed to you: ? Requires you to avoid driving or using heavy machinery. ? Can cause constipation. You may need to take these actions to prevent or treat constipation:  Drink enough fluid  to keep your urine pale yellow.  Take over-the-counter or prescription medicines.  Eat foods that are high in fiber, such as beans, whole grains, and fresh fruits and vegetables.  Limit foods that are high in fat and processed sugars, such as fried or sweet foods. Managing pain, stiffness, and  swelling   If directed, put ice on the injured area. ? Put ice in a plastic bag. ? Place a towel between your skin and the bag. ? Leave the ice on for 20 minutes, 2-3 times a day. Activity  Do not lift anything that is heavier than 10 lb (4.5 kg), or the limit that you are told, until your health care provider says that it is safe.  Return to your normal activities as told by your health care provider. Ask your health care provider what activities are safe for you.  Ask your health care provider when it is safe for you to drive.  Do exercises as told by your health care provider. General instructions  Do not use any products that contain nicotine or tobacco, such as cigarettes, e-cigarettes, and chewing tobacco. These can delay healing. If you need help quitting, ask your health care provider.  Keep all follow-up visits as told by your health care provider. This is important. How is this prevented?  Warm up and stretch before being active.  Cool down and stretch after being active.  Give your body time to rest between periods of activity.  Maintain physical fitness, including strength and flexibility.  Be safe and responsible while being active. This will help you to avoid falls. Contact a health care provider if:  You continue to have pain after 6 weeks of treatment.  You have pain or other symptoms that get worse. Summary  A subscapularis tendon injury is a tear in the strong cord of tissue (tendon) that connects one of the shoulder muscles (subscapularis muscle)to the top of the upper arm bone (humerus).  These injuries cause shoulder pain and weakness, and, in severe cases, shoulder instability.  Subscapularis tears are commonly caused by gradual wear and tear from overuse.  The condition may be treated with rest, medicines, physical therapy, and surgery if needed. This information is not intended to replace advice given to you by your health care provider. Make sure  you discuss any questions you have with your health care provider. Document Revised: 04/28/2018 Document Reviewed: 03/09/2018 Elsevier Patient Education  2020 [employer]   Subscapularis Tendon Injury Rehab Ask your health care provider which exercises are safe for you. Do exercises exactly as told by your health care provider and adjust them as directed. It is normal to feel mild stretching, pulling, tightness, or discomfort as you do these exercises. Stop right away if you feel sudden pain or your pain gets worse. Do not begin these exercises until told by your health care provider. Stretching and range-of-motion exercises These exercises warm up your muscles and joints and improve the movement and flexibility of your shoulder. These exercises also help to relieve pain. Shoulder pendulum  1. Stand near a table or counter that you can hold onto for balance. 2. Bend forward at the waist and let your left / right arm hang straight down. Use your other arm to support you and help you stay balanced. 3. Relax your left / right arm and shoulder muscles, and move your hips and your trunk so your left / right arm swings freely. Your arm should swing because of the motion of your  body, not because you are using your arm or shoulder muscles. 4. Keep moving your hips and trunk so your arm swings in the following directions, as told by your health care provider: ? Side to side. ? Forward and backward. ? In clockwise and counterclockwise circles. 5. Slowly return to the starting position. Repeat __________ times, or for __________ seconds per direction. Complete this exercise __________ times a day. Shoulder flexion, seated In this exercise, you raise your arm in front of your body until you feel a stretch in your injured shoulder. 1. Sit in a stable chair so that your left / right forearm can rest on a flat surface. Your elbow should rest at shoulder height. 2. Keeping your left / right shoulder  relaxed, lean forward at the waist and let your hand slide forward until you feel a stretch in your shoulder (flexion). 3. Hold for __________ seconds. 4. Slowly return to the starting position. Repeat __________ times. Complete this exercise __________ times a day. Strengthening exercises These exercises build strength and endurance in your shoulder. Endurance is the ability to use your muscles for a long time, even after they get tired. Shoulder extension, prone  1. Lie on your abdomen (prone position) on a firm surface so your left / right arm hangs over the edge. 2. Hold a __________ weight in your left / right hand so your palm faces in toward your body. Your arm should be straight. 3. Squeeze your shoulder blade down toward the middle of your back. 4. Slowly raise your arm behind you, up to the height of the surface that you are lying on (extension). Keep your arm straight. 5. Hold for __________ seconds. 6. Slowly return to the starting position and relax your muscles. Repeat __________ times. Complete this exercise __________ times a day. Internal rotation, isometric This is an exercise in which you press your palm against a door frame without moving your shoulder joint (isometric). 1. Stand or sit in a doorway, facing the door frame. 2. Bend your left / right elbow, and place the palm of your hand against the door frame so that only your hand is touching the frame. Keep your upper arm at your side. 3. Gently press your hand against the door frame, as if you are trying to push your arm toward your abdomen (internal rotation). Gradually increase the pressure until you are pressing as hard as you can. Stop increasing the pressure if you feel shoulder pain. ? Avoid shrugging your shoulder while you press your hand into the door frame. Keep your shoulder blade tucked down toward the middle of your back. 4. Hold for __________ seconds. 5. Slowly release the tension, and relax your muscles  completely before you repeat the exercise. Repeat __________ times. Complete this exercise __________ times a day. External rotation 1. Lie down on your left / right side. 2. Place a small pillow or a rolled-up towel between your left / right upper arm and your body. 3. Bend your left / right elbow to a 90-degree angle (right angle) so your hand is palm-down on your abdomen. 4. Squeeze your shoulder blade back toward the middle of your back. 5. Keeping your upper arm against the pillow or towel, move (pivot) your forearm and your hand away from your abdomen and toward the ceiling. Movement away from your body is called external rotation. Keep your elbow bent at a 90-degree angle. 6. Hold this position for __________ seconds. 7. Slowly return to the starting position. Repeat  __________ times. Complete this exercise __________ times a day. Scapular retraction  1. Sit in a stable chair without armrests, or stand up. 2. Secure an exercise band to a stable object in front of you so the band is at shoulder height. 3. Hold one end of the exercise band in each hand. 4. Squeeze your shoulder blades (scapulae) together and move your elbows slightly behind you (retraction). Do not shrug your shoulders upward while you do this. 5. Hold this position for __________ seconds. 6. Slowly return to the starting position. Repeat __________ times. Complete this exercise __________ times a day. This information is not intended to replace advice given to you by your health care provider. Make sure you discuss any questions you have with your health care provider. Document Revised: 04/28/2018 Document Reviewed: 04/21/2018 Elsevier Patient Education  2020 [employer].

## 2019-10-09 NOTE — Progress Notes (Signed)
Established patient visit   Patient: Denise Gregory   DOB: Aug 13, 1960   59 y.o. Female  MRN: [id number] Visit Date: 10/09/2019  Today's healthcare provider: Margaretann Loveless, PA-C   Chief Complaint  Patient presents with   Shoulder Pain   Subjective    Shoulder Pain  The pain is present in the right shoulder. This is a new problem. The current episode started more than 1 month ago (4 months ago). The problem occurs every several days (range of motion). The problem has been gradually worsening. The quality of the pain is described as aching (sharp pain sometimes). Associated symptoms include a limited range of motion. Pertinent negatives include no fever, inability to bear weight, joint locking, joint swelling, numbness, stiffness or tingling. The symptoms are aggravated by lying down and activity (seat belt passenger side and putting on a bra). She has tried nothing for the symptoms.    Patient Active Problem List   Diagnosis Date Noted   Hypercholesterolemia 10/27/2017   Anxiety 10/09/2014   History of chicken pox 10/09/2014   History of ectopic pregnancy 10/09/2014   Cannot sleep 10/09/2014   Anemia, iron deficiency 10/09/2014   Feeling stressed out 10/09/2014   Avitaminosis D 10/09/2014   Past Medical History:  Diagnosis Date   Anemia    Anxiety        Medications: Outpatient Medications Prior to Visit  Medication Sig   ALPRAZolam (XANAX) 0.5 MG tablet TAKE 1 TABLET BY MOUTH EVERY NIGHT AT BEDTIME AS NEEDED   Eszopiclone 3 MG TABS TAKE 1 TABLET BY MOUTH EVERY NIGHT AT BEDTIME. TAKE IMMEDIATELY BEFORE BEDTIME   No facility-administered medications prior to visit.    Review of Systems  Constitutional: Negative for fever.  Musculoskeletal: Positive for arthralgias and myalgias. Negative for joint swelling and stiffness.  Neurological: Negative for tingling, weakness and numbness.    Last CBC Lab Results  Component Value Date   WBC  9.1 11/14/2018   HGB 13.1 11/14/2018   HCT 37.5 11/14/2018   MCV 94 11/14/2018   MCH 32.7 11/14/2018   RDW 11.4 (L) 11/14/2018   PLT 304 11/14/2018   Last metabolic panel Lab Results  Component Value Date   GLUCOSE 89 11/14/2018   NA 141 11/14/2018   K 3.9 11/14/2018   CL 102 11/14/2018   CO2 22 11/14/2018   BUN 7 11/14/2018   CREATININE 0.50 (L) 11/14/2018   GFRNONAA 107 11/14/2018   GFRAA 123 11/14/2018   CALCIUM 9.7 11/14/2018   PROT 6.5 11/14/2018   ALBUMIN 4.4 11/14/2018   LABGLOB 2.1 11/14/2018   AGRATIO 2.1 11/14/2018   BILITOT 0.3 11/14/2018   ALKPHOS 61 11/14/2018   AST 28 11/14/2018   ALT 31 11/14/2018      Objective    BP 116/71 (BP Location: Left Arm, Patient Position: Sitting, Cuff Size: Large)    Pulse (!) 102    Temp 97.6 F (36.4 C) (Oral)    Resp 16    Wt 131 lb 3.2 oz (59.5 kg)    BMI 24.00 kg/m  BP Readings from Last 3 Encounters:  10/09/19 116/71  11/14/18 128/80  10/27/17 120/80   Wt Readings from Last 3 Encounters:  10/09/19 131 lb 3.2 oz (59.5 kg)  11/14/18 130 lb 3.2 oz (59.1 kg)  10/27/17 128 lb 6.4 oz (58.2 kg)      Physical Exam Vitals reviewed.  Constitutional:      General: She is not in acute distress.  Appearance: Normal appearance. She is well-developed. She is not ill-appearing or diaphoretic.  Cardiovascular:     Rate and Rhythm: Normal rate and regular rhythm.     Heart sounds: Normal heart sounds. No murmur heard.  No friction rub. No gallop.   Pulmonary:     Effort: Pulmonary effort is normal. No respiratory distress.     Breath sounds: Normal breath sounds. No wheezing or rales.  Musculoskeletal:     Right shoulder: No swelling, tenderness or bony tenderness. Decreased range of motion. Normal strength. Normal pulse.     Left shoulder: Normal.     Cervical back: Normal range of motion and neck supple. No tenderness. No pain with movement, spinous process tenderness or muscular tenderness. Normal range of motion.      Comments: Decreased IR and IR at 90 degree abd. Decreased abd to 90 degree. Decreased forward flexion to about 130 degree. Decreased horiz abd to about 30 degree  Neurological:     Mental Status: She is alert.       No results found for any visits on 10/09/19.  Assessment & Plan     1. Subscapularis (muscle) sprain, right, initial encounter Suspected subscapularis strain due to motions that cause pain and are limited. Will use meloxicam 15mg . Exercises printed on AVS. Discussed PT. Call if worsening and will refer to orthopedics.   2. Need for influenza vaccination Flu vaccine given today without complication. Patient sat upright for 15 minutes to check for adverse reaction before being released. - Flu Vaccine QUAD 36+ mos IM   No follow-ups on file.      , PA-C, have reviewed all documentation for this visit. The documentation on 10/12/19 for the exam, diagnosis, procedures, and orders are all accurate and complete.   10/14/19  [facility] [phone] (phone) [phone] (fax)  [facility]

## 2019-10-09 NOTE — Telephone Encounter (Signed)
Requested Prescriptions  Pending Prescriptions Disp Refills  . meloxicam (MOBIC) 15 MG tablet [Pharmacy Med Name: MELOXICAM 15MG  TABLETS] 90 tablet     Sig: TAKE 1 TABLET(15 MG) BY MOUTH DAILY     Analgesics:  COX2 Inhibitors Failed - 10/09/2019  1:51 PM      Failed - Cr in normal range and within 360 days    Creatinine, Ser  Date Value Ref Range Status  11/14/2018 0.50 (L) 0.57 - 1.00 mg/dL Final         Passed - HGB in normal range and within 360 days    Hemoglobin  Date Value Ref Range Status  11/14/2018 13.1 11.1 - 15.9 g/dL Final         Passed - Patient is not pregnant      Passed - Valid encounter within last 12 months    Recent Outpatient Visits          Today Need for influenza vaccination   [facility] M, PA-C   10 months ago Annual physical exam   [facility] [place], [place], Alessandra Bevels   1 year ago Annual physical exam   [facility] [place], [place], Alessandra Bevels   2 years ago Annual physical exam   [facility] M, M   3 years ago Diarrhea, unspecified type   [facility], [facility], PA-C      Future Appointments            In 1 month [place], Alessandra Bevels, PA-C Alessandra Bevels, [facility]

## 2019-10-16 ENCOUNTER — Other Ambulatory Visit: Payer: Self-pay | Admitting: Physician Assistant

## 2019-10-16 DIAGNOSIS — Z1231 Encounter for screening mammogram for malignant neoplasm of breast: Secondary | ICD-10-CM

## 2019-11-16 ENCOUNTER — Encounter: Payer: Self-pay | Admitting: Physician Assistant

## 2019-11-20 NOTE — Telephone Encounter (Signed)
Will forward to PCP 

## 2019-11-20 NOTE — Progress Notes (Signed)
Complete physical exam   Patient: Denise Gregory   DOB: [birth year]   59 y.o. Female  MRN: [id number] Visit Date: 11/22/2019  Today's healthcare provider: Margaretann Loveless, PA-C   Chief Complaint  Patient presents with   Annual Exam   Subjective    Denise Gregory is a 59 y.o. female who presents today for a complete physical exam.  She reports consuming a general diet. The patient does not participate in regular exercise at present. She generally feels well. She reports sleeping well. She does not have additional problems to discuss today.  HPI  11/29/19-Mammogram scheduled.  Past Medical History:  Diagnosis Date   Anemia    Anxiety    Past Surgical History:  Procedure Laterality Date   ABDOMINAL HYSTERECTOMY     Status post hysterectomy  11/19/2009   Due to fibroids; Still has right ovary   status post left oophorectomy     Due to tubal pregnancy   Social History   Socioeconomic History   Marital status: Married    Spouse name: Not on file   Number of children: Not on file   Years of education: Not on file   Highest education level: Not on file  Occupational History   Not on file  Tobacco Use   Smoking status: Never Smoker   Smokeless tobacco: Never Used  Vaping Use   Vaping Use: Never used  Substance and Sexual Activity   Alcohol use: Yes    Comment: 1-2 glasses a week   Drug use: No   Sexual activity: Not on file  Other Topics Concern   Not on file  Social History Narrative   Not on file   Social Determinants of Health   Financial Resource Strain:    Difficulty of Paying Living Expenses: Not on file  Food Insecurity:    Worried About Running Out of Food in the Last Year: Not on file   Ran Out of Food in the Last Year: Not on file  Transportation Needs:    Lack of Transportation (Medical): Not on file   Lack of Transportation (Non-Medical): Not on file  Physical Activity:    Days of Exercise per  Week: Not on file   Minutes of Exercise per Session: Not on file  Stress:    Feeling of Stress : Not on file  Social Connections:    Frequency of Communication with Friends and Family: Not on file   Frequency of Social Gatherings with Friends and Family: Not on file   Attends Religious Services: Not on file   Active Member of Clubs or Organizations: Not on file   Attends Banker Meetings: Not on file   Marital Status: Not on file  Intimate Partner Violence:    Fear of Current or Ex-Partner: Not on file   Emotionally Abused: Not on file   Physically Abused: Not on file   Sexually Abused: Not on file   Family Status  Relation Name Status   Father  Deceased at age 3       Died from Lung Cancer   Mother  Alive   PGM  (Not Specified)       senile Osteoporosis   Neg Hx  (Not Specified)   Family History  Problem Relation Age of Onset   Coronary artery disease Father    Heart attack Father    Liver cancer Father    Breast cancer Neg Hx    Allergies  Allergen  Reactions   Codeine Nausea Only    Patient Care Team: Reine Just as PCP - General (Physician Assistant)   Medications: Outpatient Medications Prior to Visit  Medication Sig   ALPRAZolam (XANAX) 0.5 MG tablet TAKE 1 TABLET BY MOUTH EVERY NIGHT AT BEDTIME AS NEEDED   Eszopiclone 3 MG TABS TAKE 1 TABLET BY MOUTH EVERY NIGHT AT BEDTIME. TAKE IMMEDIATELY BEFORE BEDTIME   meloxicam (MOBIC) 15 MG tablet TAKE 1 TABLET(15 MG) BY MOUTH DAILY   No facility-administered medications prior to visit.    Review of Systems  Constitutional: Negative.   HENT: Negative.   Eyes: Negative.   Respiratory: Negative.   Cardiovascular: Negative.   Gastrointestinal: Negative.   Endocrine: Negative.   Genitourinary: Negative.   Musculoskeletal: Negative.   Skin: Negative.   Allergic/Immunologic: Negative.   Neurological: Negative.   Hematological: Negative.    Psychiatric/Behavioral: Negative.        Objective    BP 116/79 (BP Location: Left Arm, Patient Position: Sitting, Cuff Size: Normal)    Pulse 80    Temp 97.8 F (36.6 C) (Oral)    Resp 16    Wt 130 lb (59 kg)    SpO2 99%    BMI 23.78 kg/m     Physical Exam Vitals reviewed.  Constitutional:      General: She is not in acute distress.    Appearance: Normal appearance. She is well-developed and normal weight. She is not ill-appearing or diaphoretic.  HENT:     Head: Normocephalic and atraumatic.     Right Ear: Tympanic membrane, ear canal and external ear normal.     Left Ear: Tympanic membrane, ear canal and external ear normal.     Nose: Nose normal.  Eyes:     General: No scleral icterus.       Right eye: No discharge.        Left eye: No discharge.     Extraocular Movements: Extraocular movements intact.     Conjunctiva/sclera: Conjunctivae normal.     Pupils: Pupils are equal, round, and reactive to light.  Neck:     Thyroid: No thyromegaly.     Vascular: No carotid bruit or JVD.     Trachea: No tracheal deviation.  Cardiovascular:     Rate and Rhythm: Normal rate and regular rhythm.     Pulses: Normal pulses.     Heart sounds: Normal heart sounds. No murmur heard.  No friction rub. No gallop.   Pulmonary:     Effort: Pulmonary effort is normal. No respiratory distress.     Breath sounds: Normal breath sounds. No wheezing or rales.  Chest:     Chest wall: No tenderness.  Abdominal:     General: Abdomen is flat. Bowel sounds are normal. There is no distension.     Palpations: Abdomen is soft. There is no mass.     Tenderness: There is no abdominal tenderness. There is no guarding or rebound.  Musculoskeletal:        General: No tenderness. Normal range of motion.     Cervical back: Normal range of motion and neck supple. No tenderness.     Right lower leg: No edema.     Left lower leg: No edema.  Lymphadenopathy:     Cervical: No cervical adenopathy.  Skin:     General: Skin is warm and dry.     Capillary Refill: Capillary refill takes less than 2 seconds.     Findings: No  rash.  Neurological:     General: No focal deficit present.     Mental Status: She is alert and oriented to person, place, and time. Mental status is at baseline.  Psychiatric:        Mood and Affect: Mood normal.        Behavior: Behavior normal.        Thought Content: Thought content normal.        Judgment: Judgment normal.      Last depression screening scores PHQ 2/9 Scores 11/22/2019 11/14/2018 10/27/2017  PHQ - 2 Score 0 0 0  PHQ- 9 Score - 1 -   Last fall risk screening Fall Risk  11/14/2018  Falls in the past year? 0  Number falls in past yr: 0  Injury with Fall? 0  Follow up Falls evaluation completed   Last Audit-C alcohol use screening Alcohol Use Disorder Test (AUDIT) 11/22/2019  1. How often do you have a drink containing alcohol? 3  2. How many drinks containing alcohol do you have on a typical day when you are drinking? 0  3. How often do you have six or more drinks on one occasion? 0  AUDIT-C Score 3  Alcohol Brief Interventions/Follow-up AUDIT Score <7 follow-up not indicated   A score of 3 or more in women, and 4 or more in men indicates increased risk for alcohol abuse, EXCEPT if all of the points are from question 1   No results found for any visits on 11/22/19.  Assessment & Plan    Routine Health Maintenance and Physical Exam  Exercise Activities and Dietary recommendations Goals   None     Immunization History  Administered Date(s) Administered   Influenza,inj,Quad PF,6+ Mos 10/12/2014, 10/14/2015, 10/26/2016, 10/27/2017, 11/14/2018, 10/09/2019   PFIZER SARS-COV-2 Vaccination 03/24/2019, 04/14/2019   Tdap 05/06/2010   Zoster Recombinat (Shingrix) 11/14/2018, 01/18/2019    Health Maintenance  Topic Date Due   MAMMOGRAM  11/28/2019   TETANUS/TDAP  05/05/2020   COLONOSCOPY  09/23/2020   INFLUENZA VACCINE  Completed    COVID-19 Vaccine  Completed   Hepatitis C Screening  Completed   HIV Screening  Completed    Discussed health benefits of physical activity, and encouraged her to engage in regular exercise appropriate for her age and condition.  1. Annual physical exam Normal physical exam today. Will check labs as below and f/u pending lab results. If labs are stable and WNL she will not need to have these rechecked for one year at her next annual physical exam. She is to call the office in the meantime if she has any acute issue, questions or concerns. - CBC with Differential/Platelet - Comprehensive metabolic panel - Lipid panel - Hemoglobin A1c - TSH  2. Encounter for breast cancer screening using non-mammogram modality Mammogram already scheduled. Patient performs self breast exams. Encouraged to continue.    No follow-ups on file.     Delmer Islam, PA-C, have reviewed all documentation for this visit. The documentation on 11/23/19 for the exam, diagnosis, procedures, and orders are all accurate and complete.   Reine Just  [facility] [phone] (phone) [phone] (fax)  [facility]

## 2019-11-22 ENCOUNTER — Ambulatory Visit (INDEPENDENT_AMBULATORY_CARE_PROVIDER_SITE_OTHER): Payer: Managed Care, Other (non HMO) | Admitting: Physician Assistant

## 2019-11-22 ENCOUNTER — Other Ambulatory Visit: Payer: Self-pay

## 2019-11-22 ENCOUNTER — Encounter: Payer: Self-pay | Admitting: Physician Assistant

## 2019-11-22 VITALS — BP 116/79 | HR 80 | Temp 97.8°F | Resp 16 | Wt 130.0 lb

## 2019-11-22 DIAGNOSIS — Z1239 Encounter for other screening for malignant neoplasm of breast: Secondary | ICD-10-CM

## 2019-11-22 DIAGNOSIS — Z0000 Encounter for general adult medical examination without abnormal findings: Secondary | ICD-10-CM | POA: Diagnosis not present

## 2019-11-22 NOTE — Patient Instructions (Signed)

## 2019-11-29 ENCOUNTER — Ambulatory Visit
Admission: RE | Admit: 2019-11-29 | Discharge: 2019-11-29 | Disposition: A | Discharge status: Home / Self Care | Payer: Managed Care, Other (non HMO) | Source: Ambulatory Visit | Attending: Physician Assistant | Admitting: Physician Assistant

## 2019-11-29 ENCOUNTER — Other Ambulatory Visit: Payer: Self-pay

## 2019-11-29 DIAGNOSIS — Z1231 Encounter for screening mammogram for malignant neoplasm of breast: Secondary | ICD-10-CM | POA: Insufficient documentation

## 2019-12-01 ENCOUNTER — Telehealth: Payer: Self-pay

## 2019-12-01 NOTE — Telephone Encounter (Signed)
-----   Message from Margaretann Loveless, [place] sent at 12/01/2019 10:03 AM EST ----- Normal mammogram. Repeat screening in one year.

## 2019-12-01 NOTE — Telephone Encounter (Signed)
Written by Margaretann Loveless, PA-C on 12/01/2019 10:03 AM EST Seen by patient Denise Gregory on 12/01/2019 10:13 AM

## 2019-12-03 ENCOUNTER — Other Ambulatory Visit: Payer: Self-pay | Admitting: Physician Assistant

## 2019-12-03 DIAGNOSIS — S46811A Strain of other muscles, fascia and tendons at shoulder and upper arm level, right arm, initial encounter: Secondary | ICD-10-CM

## 2019-12-19 ENCOUNTER — Encounter: Payer: Self-pay | Admitting: Physician Assistant

## 2019-12-19 DIAGNOSIS — S46811A Strain of other muscles, fascia and tendons at shoulder and upper arm level, right arm, initial encounter: Secondary | ICD-10-CM

## 2019-12-20 MED ORDER — MELOXICAM 15 MG PO TABS: ORAL_TABLET | ORAL | 1 refills | Status: DC

## 2020-01-01 ENCOUNTER — Encounter: Payer: Self-pay | Admitting: Physician Assistant

## 2020-01-01 DIAGNOSIS — F5101 Primary insomnia: Secondary | ICD-10-CM

## 2020-01-01 MED ORDER — ALPRAZOLAM 0.5 MG PO TABS: 0.5000 mg | ORAL_TABLET | Freq: Every evening | ORAL | 5 refills | Status: DC | PRN

## 2020-03-29 ENCOUNTER — Other Ambulatory Visit: Payer: Self-pay | Admitting: Physician Assistant

## 2020-03-29 DIAGNOSIS — F5101 Primary insomnia: Secondary | ICD-10-CM

## 2020-03-29 NOTE — Telephone Encounter (Signed)
Requested medication (s) are due for refill today: yes  Requested medication (s) are on the active medication list: yes  Last refill:  12/16/2019  Future visit scheduled: no  Notes to clinic:  this refill cannot be delegated    Requested Prescriptions  Pending Prescriptions Disp Refills   Eszopiclone 3 MG TABS [Pharmacy Med Name: ESZOPICLONE 3MG  TABLETS] 90 tablet     Sig: TAKE 1 TABLET BY MOUTH EVERY NIGHT AT BEDTIME IMMEDIATELY BEFORE BEDTIME      Not Delegated - Psychiatry:  Anxiolytics/Hypnotics Failed - 03/29/2020  8:38 AM      Failed - This refill cannot be delegated      Failed - Urine Drug Screen completed in last 360 days      Passed - Valid encounter within last 6 months    Recent Outpatient Visits           4 months ago Annual physical exam   [facility] M, PA-C   5 months ago Subscapularis (muscle) sprain, right, initial encounter   [facility] [place], [place], Alessandra Bevels   1 year ago Annual physical exam   [facility] [place], [place], Alessandra Bevels   2 years ago Annual physical exam   [facility] [place], [place], Alessandra Bevels   3 years ago Annual physical exam   [facility] [place], [place], [place]

## 2020-04-29 ENCOUNTER — Encounter: Payer: Self-pay | Admitting: Physician Assistant

## 2020-10-03 ENCOUNTER — Other Ambulatory Visit: Payer: Self-pay | Admitting: Physician Assistant

## 2020-10-03 DIAGNOSIS — F5101 Primary insomnia: Secondary | ICD-10-CM

## 2020-10-09 ENCOUNTER — Encounter: Payer: Self-pay | Admitting: Family Medicine

## 2020-10-09 ENCOUNTER — Other Ambulatory Visit: Payer: Self-pay | Admitting: *Deleted

## 2020-10-09 DIAGNOSIS — F5101 Primary insomnia: Secondary | ICD-10-CM

## 2020-10-09 MED ORDER — ESZOPICLONE 3 MG PO TABS: ORAL_TABLET | ORAL | 0 refills | Status: DC

## 2020-10-09 NOTE — Telephone Encounter (Signed)
Please advise refill?  LOV: 11/22/2019 NOV: 11/19/2020 Last refill: 03/29/2020 #90 w/1 refill

## 2020-10-15 ENCOUNTER — Encounter: Payer: Self-pay | Admitting: Family Medicine

## 2020-10-15 ENCOUNTER — Other Ambulatory Visit: Payer: Self-pay | Admitting: *Deleted

## 2020-10-15 DIAGNOSIS — Z1231 Encounter for screening mammogram for malignant neoplasm of breast: Secondary | ICD-10-CM

## 2020-11-06 ENCOUNTER — Other Ambulatory Visit: Payer: Self-pay

## 2020-11-06 DIAGNOSIS — F5101 Primary insomnia: Secondary | ICD-10-CM

## 2020-11-06 DIAGNOSIS — Z1231 Encounter for screening mammogram for malignant neoplasm of breast: Secondary | ICD-10-CM

## 2020-11-25 ENCOUNTER — Encounter: Payer: Managed Care, Other (non HMO) | Admitting: Family Medicine

## 2020-12-03 ENCOUNTER — Other Ambulatory Visit: Payer: Self-pay

## 2020-12-03 ENCOUNTER — Ambulatory Visit
Admission: RE | Admit: 2020-12-03 | Discharge: 2020-12-03 | Disposition: A | Discharge status: Home / Self Care | Payer: Managed Care, Other (non HMO) | Source: Ambulatory Visit | Attending: Family Medicine | Admitting: Family Medicine

## 2020-12-03 DIAGNOSIS — Z1231 Encounter for screening mammogram for malignant neoplasm of breast: Secondary | ICD-10-CM | POA: Diagnosis not present

## 2021-01-13 ENCOUNTER — Other Ambulatory Visit: Payer: Self-pay | Admitting: Physician Assistant

## 2021-01-13 DIAGNOSIS — F5101 Primary insomnia: Secondary | ICD-10-CM

## 2021-01-14 ENCOUNTER — Encounter: Payer: Self-pay | Admitting: Family Medicine

## 2021-01-14 DIAGNOSIS — F5101 Primary insomnia: Secondary | ICD-10-CM

## 2021-01-15 MED ORDER — ALPRAZOLAM 0.5 MG PO TABS: 0.5000 mg | ORAL_TABLET | Freq: Every evening | ORAL | 1 refills | Status: DC | PRN

## 2021-01-15 NOTE — Telephone Encounter (Signed)
Patient needs a refill on Alprazolam. Please review refill request.   Last refill : 01/01/2020 #30 with 5 refills Last office visit:11/22/2019 Next appointment: 02/26/2021 with Dr. Sherrie Mustache  **This is a former Denise Gregory patient. Please advise on refill request.   **Also, her next appointment is scheduled with Dr. Sherrie Mustache on 02/26/2021 for a CPE. I think this may be a scheduling error since Dr. Sherrie Mustache was not taking on Denise Gregory's former patients. Does this appointment need to be moved to [place] or Lindsay's schedule? Please advise.

## 2021-01-15 NOTE — Telephone Encounter (Signed)
Yes, please reschedule CPE with elise or lindsay. Have sent refill to get he by until then.

## 2021-01-24 ENCOUNTER — Ambulatory Visit (INDEPENDENT_AMBULATORY_CARE_PROVIDER_SITE_OTHER): Payer: Managed Care, Other (non HMO) | Admitting: Physician Assistant

## 2021-01-24 ENCOUNTER — Encounter: Payer: Self-pay | Admitting: Physician Assistant

## 2021-01-24 ENCOUNTER — Other Ambulatory Visit: Payer: Self-pay

## 2021-01-24 VITALS — BP 113/87 | HR 92 | Temp 98.2°F | Resp 16 | Ht 62.0 in | Wt 132.4 lb

## 2021-01-24 DIAGNOSIS — Z0000 Encounter for general adult medical examination without abnormal findings: Secondary | ICD-10-CM | POA: Diagnosis not present

## 2021-01-24 DIAGNOSIS — E559 Vitamin D deficiency, unspecified: Secondary | ICD-10-CM | POA: Diagnosis not present

## 2021-01-24 DIAGNOSIS — Z13820 Encounter for screening for osteoporosis: Secondary | ICD-10-CM

## 2021-01-24 DIAGNOSIS — E7800 Pure hypercholesterolemia, unspecified: Secondary | ICD-10-CM | POA: Diagnosis not present

## 2021-01-24 DIAGNOSIS — Z1211 Encounter for screening for malignant neoplasm of colon: Secondary | ICD-10-CM

## 2021-01-24 NOTE — Progress Notes (Signed)
Complete physical exam   Patient: Denise Gregory   DOB: 12-12-1960   61 y.o. Female  MRN: [id number] Visit Date: 01/24/2021  Today's healthcare provider: Mikey Kirschner, PA-C   Chief Complaint  Patient presents with   Annual Exam   Subjective    Denise Gregory is a 61 y.o. female who presents today for a complete physical exam.  She reports consuming a general diet. The patient does not participate in regular exercise at present. She generally feels well. She reports sleeping poorly. She does not have additional problems to discuss today.  She takes xanax occasionally for sleep, maybe 1-2 times a week. Struggles with falling asleep.   Past Medical History:  Diagnosis Date   Anemia    Anxiety    Past Surgical History:  Procedure Laterality Date   ABDOMINAL HYSTERECTOMY     Status post hysterectomy  11/19/2009   Due to fibroids; Still has right ovary   status post left oophorectomy     Due to tubal pregnancy   Social History   Socioeconomic History   Marital status: Married    Spouse name: Not on file   Number of children: Not on file   Years of education: Not on file   Highest education level: Not on file  Occupational History   Not on file  Tobacco Use   Smoking status: Never   Smokeless tobacco: Never  Vaping Use   Vaping Use: Never used  Substance and Sexual Activity   Alcohol use: Yes    Comment: 1-2 glasses a week   Drug use: No   Sexual activity: Not on file  Other Topics Concern   Not on file  Social History Narrative   Not on file   Social Determinants of Health   Financial Resource Strain: Not on file  Food Insecurity: Not on file  Transportation Needs: Not on file  Physical Activity: Not on file  Stress: Not on file  Social Connections: Not on file  Intimate Partner Violence: Not on file   Family Status  Relation Name Status   Father  Deceased at age 65       Died from [place]   Mother  Alive   PGM  (Not  Specified)       senile Osteoporosis   Neg Hx  (Not Specified)   Family History  Problem Relation Age of Onset   Coronary artery disease Father    Heart attack Father    Liver cancer Father    Breast cancer Neg Hx    Allergies  Allergen Reactions   Codeine Nausea Only    Patient Care Team: Mikey Kirschner, PA-C as PCP - General (Physician Assistant)   Medications: Outpatient Medications Prior to Visit  Medication Sig   ALPRAZolam (XANAX) 0.5 MG tablet Take 1 tablet (0.5 mg total) by mouth at bedtime as needed.   Eszopiclone 3 MG TABS TAKE 1 TABLET BY MOUTH EVERY NIGHT AT BEDTIME IMMEDIATELY BEFORE BEDTIME   [DISCONTINUED] meloxicam (MOBIC) 15 MG tablet TAKE 1 TABLET(15 MG) BY MOUTH DAILY (Patient not taking: Reported on 01/24/2021)   No facility-administered medications prior to visit.    Review of Systems  Constitutional:  Positive for unexpected weight change.  HENT: Negative.    Eyes: Negative.   Respiratory: Negative.    Cardiovascular: Negative.   Gastrointestinal: Negative.   Endocrine: Negative.   Genitourinary: Negative.   Musculoskeletal: Negative.   Skin: Negative.   Allergic/Immunologic: Negative.  Neurological: Negative.   Hematological: Negative.   Psychiatric/Behavioral:  The patient is nervous/anxious.     Objective    BP 113/87 (BP Location: Left Arm, Patient Position: Sitting, Cuff Size: Normal)    Pulse 92    Temp 98.2 F (36.8 C) (Oral)    Resp 16    Ht 5\' 2"  (1.575 m)    Wt 132 lb 6.4 oz (60.1 kg)    BMI 24.22 kg/m   Physical Exam Constitutional:      General: She is awake.     Appearance: She is well-developed.  HENT:     Head: Normocephalic.     Right Ear: Tympanic membrane normal.     Left Ear: Tympanic membrane normal.  Eyes:     Conjunctiva/sclera: Conjunctivae normal.     Pupils: Pupils are equal, round, and reactive to light.  Neck:     Thyroid: No thyroid mass or thyromegaly.  Cardiovascular:     Rate and Rhythm: Normal rate  and regular rhythm.     Heart sounds: Normal heart sounds.  Pulmonary:     Effort: Pulmonary effort is normal.     Breath sounds: Normal breath sounds.  Abdominal:     Palpations: Abdomen is soft.     Tenderness: There is no abdominal tenderness.  Musculoskeletal:     Right lower leg: No swelling.     Left lower leg: No swelling.  Lymphadenopathy:     Cervical: No cervical adenopathy.  Skin:    General: Skin is warm.  Neurological:     Mental Status: She is alert and oriented to person, place, and time.  Psychiatric:        Attention and Perception: Attention normal.        Mood and Affect: Mood normal.        Speech: Speech normal.        Behavior: Behavior is cooperative.      Last depression screening scores PHQ 2/9 Scores 01/24/2021 11/22/2019 11/14/2018  PHQ - 2 Score 0 0 0  PHQ- 9 Score - - 1   Last fall risk screening Fall Risk  01/24/2021  Falls in the past year? 0  Number falls in past yr: 0  Injury with Fall? 0  Follow up -   Last Audit-C alcohol use screening Alcohol Use Disorder Test (AUDIT) 11/22/2019  1. How often do you have a drink containing alcohol? 3  2. How many drinks containing alcohol do you have on a typical day when you are drinking? 0  3. How often do you have six or more drinks on one occasion? 0  AUDIT-C Score 3  Alcohol Brief Interventions/Follow-up AUDIT Score <7 follow-up not indicated   A score of 3 or more in women, and 4 or more in men indicates increased risk for alcohol abuse, EXCEPT if all of the points are from question 1   No results found for any visits on 01/24/21.  Assessment & Plan    Routine Health Maintenance and Physical Exam  Exercise Activities and Dietary recommendations Increase exercise/movement daily Focus on balanced diet   Immunization History  Administered Date(s) Administered   Influenza,inj,Quad PF,6+ Mos 10/12/2014, 10/14/2015, 10/26/2016, 10/27/2017, 11/14/2018, 10/09/2019   PFIZER(Purple Top)SARS-COV-2  Vaccination 03/24/2019, 04/14/2019   Tdap 05/06/2010   Zoster Recombinat (Shingrix) 11/14/2018, 01/18/2019    Health Maintenance  Topic Date Due   COVID-19 Vaccine (3 - Booster for Pfizer series) 06/09/2019   TETANUS/TDAP  05/05/2020   INFLUENZA VACCINE  08/19/2020  COLONOSCOPY (Pts 45-22yrs Insurance coverage will need to be confirmed)  09/23/2020   MAMMOGRAM  12/03/2021   Hepatitis C Screening  Completed   HIV Screening  Completed   Zoster Vaccines- Shingrix  Completed   Pneumococcal Vaccine 43-75 Years old  Aged Out   HPV VACCINES  Aged Out    Discussed health benefits of physical activity, and encouraged her to engage in regular exercise appropriate for her age and condition.  Problem List Items Addressed This Visit       Other   Vitamin D deficiency    Advised to take 2,000 iu daily of vitamin D.      Relevant Orders   Vitamin D (25 hydroxy)   Hypercholesterolemia   Relevant Orders   Comprehensive metabolic panel   Lipid panel   Other Visit Diagnoses     Annual physical exam    -  Primary   Osteoporosis screening       Relevant Orders   DG Bone Density   Colon cancer screening       Relevant Orders   Ambulatory referral to Gastroenterology       Return in about 6 months (around 07/24/2021) for insomnia.     I, Mikey Kirschner, PA-C have reviewed all documentation for this visit. The documentation on 01/24/21 for the exam, diagnosis, procedures, and orders are all accurate and complete.    Mikey Kirschner, PA-C  [facility] [phone] (phone) [phone] (fax)  [place]

## 2021-01-24 NOTE — Assessment & Plan Note (Signed)
Advised to take 2,000 iu daily of vitamin D.

## 2021-01-24 NOTE — Patient Instructions (Signed)
2,000 IU a day vit D

## 2021-01-25 LAB — COMPREHENSIVE METABOLIC PANEL
ALT: 81 IU/L — ABNORMAL HIGH (ref 0–32)
AST: 64 IU/L — ABNORMAL HIGH (ref 0–40)
Albumin/Globulin Ratio: 1.8 (ref 1.2–2.2)
Albumin: 4.5 g/dL (ref 3.8–4.9)
Alkaline Phosphatase: 71 IU/L (ref 44–121)
BUN/Creatinine Ratio: 16 (ref 12–28)
BUN: 9 mg/dL (ref 8–27)
Bilirubin Total: <0.2 mg/dL (ref 0.0–1.2)
CO2: 22 mmol/L (ref 20–29)
Calcium: 9.3 mg/dL (ref 8.7–10.3)
Chloride: 102 mmol/L (ref 96–106)
Creatinine, Ser: 0.56 mg/dL — ABNORMAL LOW (ref 0.57–1.00)
Globulin, Total: 2.5 g/dL (ref 1.5–4.5)
Glucose: 92 mg/dL (ref 70–99)
Potassium: 4.5 mmol/L (ref 3.5–5.2)
Sodium: 141 mmol/L (ref 134–144)
Total Protein: 7 g/dL (ref 6.0–8.5)
eGFR: 104 mL/min/{1.73_m2} (ref >59)

## 2021-01-25 LAB — LIPID PANEL
Chol/HDL Ratio: 3.6 ratio (ref 0.0–4.4)
Cholesterol, Total: 220 mg/dL — ABNORMAL HIGH (ref 100–199)
HDL: 61 mg/dL (ref >39)
LDL Chol Calc (NIH): 145 mg/dL — ABNORMAL HIGH (ref 0–99)
Triglycerides: 82 mg/dL (ref 0–149)
VLDL Cholesterol Cal: 14 mg/dL (ref 5–40)

## 2021-01-25 LAB — VITAMIN D 25 HYDROXY (VIT D DEFICIENCY, FRACTURES): Vit D, 25-Hydroxy: 27.4 ng/mL — ABNORMAL LOW (ref 30.0–100.0)

## 2021-02-08 ENCOUNTER — Other Ambulatory Visit: Payer: Self-pay | Admitting: Family Medicine

## 2021-02-08 DIAGNOSIS — F5101 Primary insomnia: Secondary | ICD-10-CM

## 2021-02-08 NOTE — Telephone Encounter (Signed)
Requested medication (s) are due for refill today: yes  Requested medication (s) are on the active medication list: yes  Last refill:  10/09/20 #90  Future visit scheduled: no  Notes to clinic:  med not delegated to NT to RF   Requested Prescriptions  Pending Prescriptions Disp Refills   Eszopiclone 3 MG TABS [Pharmacy Med Name: ESZOPICLONE 3MG  TABLETS] 90 tablet     Sig: TAKE 1 TABLET BY MOUTH IMMEDIATELY BEFORE BEDTIME     Not Delegated - Psychiatry:  Anxiolytics/Hypnotics Failed - 02/08/2021  4:43 PM      Failed - This refill cannot be delegated      Failed - Urine Drug Screen completed in last 360 days      Passed - Valid encounter within last 6 months    Recent Outpatient Visits           2 weeks ago Annual physical exam   [facility], PA-C   1 year ago Annual physical exam   [facility] [place], [place], Alessandra Bevels   1 year ago Subscapularis (muscle) sprain, right, initial encounter   [facility] [place], [place], Alessandra Bevels   2 years ago Annual physical exam   [facility] [place], [place], [place]andra Bevels   3 years ago Annual physical exam   [facility] [place], [place], [place]

## 2021-02-11 NOTE — Telephone Encounter (Signed)
Pt states she does not take them together.  She says she uses "Xanax for other things."  The sig states to take it at bedtime as needed.   Thanks,   -Mickel Baas

## 2021-02-11 NOTE — Telephone Encounter (Signed)
LOV: 01/24/2021   Thanks,   -Vernona Rieger

## 2021-02-12 NOTE — Telephone Encounter (Signed)
pdmp checked. Last fill 10/09/20 for 90 Not filling 90, will do 30 and one refill to track how often she is taking Pt aware not to take xanax and lunesta at the same time.

## 2021-02-26 ENCOUNTER — Encounter: Payer: Managed Care, Other (non HMO) | Admitting: Family Medicine

## 2021-03-12 ENCOUNTER — Other Ambulatory Visit: Payer: Managed Care, Other (non HMO)

## 2021-03-18 ENCOUNTER — Ambulatory Visit
Admission: RE | Admit: 2021-03-18 | Discharge: 2021-03-18 | Disposition: A | Discharge status: Home / Self Care | Payer: Managed Care, Other (non HMO) | Source: Ambulatory Visit | Attending: Physician Assistant | Admitting: Physician Assistant

## 2021-03-18 ENCOUNTER — Other Ambulatory Visit: Payer: Self-pay

## 2021-03-18 DIAGNOSIS — Z13820 Encounter for screening for osteoporosis: Secondary | ICD-10-CM | POA: Diagnosis not present

## 2021-04-25 NOTE — Progress Notes (Signed)
?I,Denise Gregory,acting as a scribe for [employer], PA-C.,have documented all relevant documentation on the behalf of Denise Kirschner, PA-C,as directed by  Denise Kirschner, PA-C while in the presence of Denise Kirschner, PA-C. ? ?Established Patient Office Visit ? ?Subjective:  ?Patient ID: Denise Gregory, female    DOB: 26-Jul-1960  Age: 61 y.o. MRN: [id number] ? ?CC:  ?Denise Gregory presents for 3 month follow-up to discuss osteoporosis and complete her follow up bloodwork. ?She reports starting a vitamin D supplement w/ calcium that she tries to take daily. ? ?Past Medical History:  ?Diagnosis Date  ? Anemia   ? Anxiety   ? ? ?Past Surgical History:  ?Procedure Laterality Date  ? ABDOMINAL HYSTERECTOMY    ? Status post hysterectomy  11/19/2009  ? Due to fibroids; Still has right ovary  ? status post left oophorectomy    ? Due to tubal pregnancy  ? ? ?Family History  ?Problem Relation Age of Onset  ? Coronary artery disease Father   ? Heart attack Father   ? Liver cancer Father   ? Breast cancer Neg Hx   ? ? ?Social History  ? ?Socioeconomic History  ? Marital status: Married  ?  Spouse name: Not on file  ? Number of children: Not on file  ? Years of education: Not on file  ? Highest education level: Not on file  ?Occupational History  ? Not on file  ?Tobacco Use  ? Smoking status: Never  ? Smokeless tobacco: Never  ?Vaping Use  ? Vaping Use: Never used  ?Substance and Sexual Activity  ? Alcohol use: Yes  ?  Comment: 1-2 glasses a week  ? Drug use: No  ? Sexual activity: Not on file  ?Other Topics Concern  ? Not on file  ?Social History Narrative  ? Not on file  ? ?Social Determinants of Health  ? ?Financial Resource Strain: Not on file  ?Food Insecurity: Not on file  ?Transportation Needs: Not on file  ?Physical Activity: Not on file  ?Stress: Not on file  ?Social Connections: Not on file  ?Intimate Partner Violence: Not on file  ? ? ?Outpatient Medications Prior to Visit  ?Medication Sig Dispense  Refill  ? ALPRAZolam (XANAX) 0.5 MG tablet Take 1 tablet (0.5 mg total) by mouth at bedtime as needed. 30 tablet 1  ? Eszopiclone 3 MG TABS TAKE 1 TABLET BY MOUTH IMMEDIATELY BEFORE BEDTIME as needed for sleep. Do not take with other sedating agents (Patient not taking: Reported on 04/28/2021) 30 tablet 1  ? ?No facility-administered medications prior to visit.  ? ? ?Allergies  ?Allergen Reactions  ? Codeine Nausea Only  ? ? ?ROS ?Review of Systems  ?Constitutional:  Negative for fatigue and fever.  ?Respiratory:  Negative for cough and shortness of breath.   ?Cardiovascular:  Negative for chest pain and leg swelling.  ?Gastrointestinal:  Negative for abdominal pain.  ?Neurological:  Negative for dizziness and headaches.  ? ?  ?Objective:  ?  ?Physical Exam ?Constitutional:   ?   Appearance: Normal appearance. She is not ill-appearing.  ?HENT:  ?   Head: Normocephalic.  ?Eyes:  ?   Conjunctiva/sclera: Conjunctivae normal.  ?Cardiovascular:  ?   Rate and Rhythm: Normal rate.  ?Pulmonary:  ?   Effort: Pulmonary effort is normal.  ?Neurological:  ?   Mental Status: She is oriented to person, place, and time.  ?Psychiatric:     ?   Mood and Affect: Mood normal.     ?  Behavior: Behavior normal.  ? ? ?BP 115/75 (BP Location: Right Arm, Patient Position: Sitting, Cuff Size: Normal)   Pulse 80   Ht 5' 2" (1.575 m)   Wt 134 lb 4.8 oz (60.9 kg)   BMI 24.56 kg/m?  ?Wt Readings from Last 3 Encounters:  ?04/28/21 134 lb 4.8 oz (60.9 kg)  ?01/24/21 132 lb 6.4 oz (60.1 kg)  ?11/22/19 130 lb (59 kg)  ? ? ? ?Health Maintenance Due  ?Topic Date Due  ? COVID-19 Vaccine (3 - Booster for Pfizer series) 06/09/2019  ? TETANUS/TDAP  05/05/2020  ? COLONOSCOPY (Pts 45-49yrs Insurance coverage will need to be confirmed)  09/23/2020  ? ? ?There are no preventive care reminders to display for this patient. ? ?Lab Results  ?Component Value Date  ? TSH 2.280 11/14/2018  ? ?Lab Results  ?Component Value Date  ? WBC 9.1 11/14/2018  ? HGB 13.1  11/14/2018  ? HCT 37.5 11/14/2018  ? MCV 94 11/14/2018  ? PLT 304 11/14/2018  ? ?Lab Results  ?Component Value Date  ? NA 141 01/24/2021  ? K 4.5 01/24/2021  ? CO2 22 01/24/2021  ? GLUCOSE 92 01/24/2021  ? BUN 9 01/24/2021  ? CREATININE 0.56 (L) 01/24/2021  ? BILITOT <0.2 01/24/2021  ? ALKPHOS 71 01/24/2021  ? AST 64 (H) 01/24/2021  ? ALT 81 (H) 01/24/2021  ? PROT 7.0 01/24/2021  ? ALBUMIN 4.5 01/24/2021  ? CALCIUM 9.3 01/24/2021  ? EGFR 104 01/24/2021  ? ?Lab Results  ?Component Value Date  ? CHOL 220 (H) 01/24/2021  ? ?Lab Results  ?Component Value Date  ? HDL 61 01/24/2021  ? ?Lab Results  ?Component Value Date  ? LDLCALC 145 (H) 01/24/2021  ? ?Lab Results  ?Component Value Date  ? TRIG 82 01/24/2021  ? ?Lab Results  ?Component Value Date  ? CHOLHDL 3.6 01/24/2021  ? ?Lab Results  ?Component Value Date  ? HGBA1C 5.2 11/14/2018  ? ? ?  ?Assessment & Plan:  ? ?Problem List Items Addressed This Visit   ? ?  ? Musculoskeletal and Integument  ? Age-related osteoporosis without current pathological fracture - Primary  ?  Advised starting fosamax 10 mg daily -- pt would prefer daily over weekly.  ?Discussed taking on empty stomach, 30 minutes before eating, and sitting upright ?If tolerating well would recheck dexa 1 year ?  ?  ? Relevant Medications  ? alendronate (FOSAMAX) 10 MG tablet  ? Other Relevant Orders  ? CBC  ?  ? Other  ? Avitaminosis D  ?  Continue vit d replacement, 2,000 iu daily ?  ?  ? Relevant Orders  ? Vitamin D (25 hydroxy)  ? Abnormal liver enzymes  ?  On last CMP ?Advised rechecking--pt drank yesterday as it was Easter, advised abstaining from etoh from a while and then rechecking ?  ?  ? Relevant Orders  ? Hepatic function panel  ?  ? ?Follow-up: Return as scheduled.  ? ? ?I, Lindsay Drubel, PA-C have reviewed all documentation for this visit. The documentation on  04/28/2021 for the exam, diagnosis, procedures, and orders are all accurate and complete. ? ?Lindsay Drubel, PA-C ?[facility] ?[address] ?[place], [place], [address] ?Office: [phone] ?Fax: [phone]  ?

## 2021-04-28 ENCOUNTER — Ambulatory Visit: Payer: Managed Care, Other (non HMO) | Admitting: Physician Assistant

## 2021-04-28 ENCOUNTER — Encounter: Payer: Self-pay | Admitting: Physician Assistant

## 2021-04-28 VITALS — BP 115/75 | HR 80 | Ht 62.0 in | Wt 134.3 lb

## 2021-04-28 DIAGNOSIS — E559 Vitamin D deficiency, unspecified: Secondary | ICD-10-CM | POA: Diagnosis not present

## 2021-04-28 DIAGNOSIS — R748 Abnormal levels of other serum enzymes: Secondary | ICD-10-CM | POA: Diagnosis not present

## 2021-04-28 DIAGNOSIS — M810 Age-related osteoporosis without current pathological fracture: Secondary | ICD-10-CM | POA: Diagnosis not present

## 2021-04-28 MED ORDER — ALENDRONATE SODIUM 10 MG PO TABS: 10.0000 mg | ORAL_TABLET | Freq: Every day | ORAL | 1 refills | Status: DC

## 2021-04-28 NOTE — Assessment & Plan Note (Signed)
On last CMP ?Advised rechecking--pt drank yesterday as it was Easter, advised abstaining from etoh from a while and then rechecking ?

## 2021-04-28 NOTE — Assessment & Plan Note (Signed)
Continue vit d replacement, 2,000 iu daily ?

## 2021-04-28 NOTE — Assessment & Plan Note (Addendum)
Advised starting fosamax 10 mg daily -- pt would prefer daily over weekly.  ?Discussed taking on empty stomach, 30 minutes before eating, and sitting upright ?If tolerating well would recheck dexa 1 year ?

## 2021-04-30 ENCOUNTER — Encounter: Payer: Self-pay | Admitting: Physician Assistant

## 2021-07-09 ENCOUNTER — Other Ambulatory Visit: Payer: Self-pay | Admitting: Family Medicine

## 2021-07-09 DIAGNOSIS — F5101 Primary insomnia: Secondary | ICD-10-CM

## 2021-10-13 ENCOUNTER — Encounter: Payer: Self-pay | Admitting: Physician Assistant

## 2021-10-15 LAB — CBC
Hematocrit: 41.3 % (ref 34.0–46.6)
Hemoglobin: 14.2 g/dL (ref 11.1–15.9)
MCH: 31.7 pg (ref 26.6–33.0)
MCHC: 34.4 g/dL (ref 31.5–35.7)
MCV: 92 fL (ref 79–97)
Platelets: 294 10*3/uL (ref 150–450)
RBC: 4.48 x10E6/uL (ref 3.77–5.28)
RDW: 11.9 % (ref 11.7–15.4)
WBC: 11 10*3/uL — ABNORMAL HIGH (ref 3.4–10.8)

## 2021-10-15 LAB — HEPATIC FUNCTION PANEL
ALT: 16 IU/L (ref 0–32)
AST: 20 IU/L (ref 0–40)
Albumin: 4.8 g/dL (ref 3.9–4.9)
Alkaline Phosphatase: 56 IU/L (ref 44–121)
Bilirubin Total: 0.3 mg/dL (ref 0.0–1.2)
Bilirubin, Direct: <0.1 mg/dL (ref 0.00–0.40)
Total Protein: 7.2 g/dL (ref 6.0–8.5)

## 2021-10-15 LAB — VITAMIN D 25 HYDROXY (VIT D DEFICIENCY, FRACTURES): Vit D, 25-Hydroxy: 33.2 ng/mL (ref 30.0–100.0)

## 2021-10-22 LAB — LIPID PANEL W/O CHOL/HDL RATIO
Cholesterol, Total: 296 mg/dL — ABNORMAL HIGH (ref 100–199)
HDL: 65 mg/dL (ref >39)
LDL Chol Calc (NIH): 217 mg/dL — ABNORMAL HIGH (ref 0–99)
Triglycerides: 85 mg/dL (ref 0–149)
VLDL Cholesterol Cal: 14 mg/dL (ref 5–40)

## 2021-10-22 LAB — SPECIMEN STATUS REPORT

## 2021-10-23 ENCOUNTER — Other Ambulatory Visit: Payer: Self-pay | Admitting: Physician Assistant

## 2021-10-23 DIAGNOSIS — M810 Age-related osteoporosis without current pathological fracture: Secondary | ICD-10-CM

## 2021-10-28 ENCOUNTER — Ambulatory Visit: Payer: Managed Care, Other (non HMO) | Admitting: Physician Assistant

## 2021-10-28 ENCOUNTER — Encounter: Payer: Self-pay | Admitting: Physician Assistant

## 2021-10-28 VITALS — BP 104/72 | HR 77 | Temp 97.7°F | Resp 14 | Wt 134.0 lb

## 2021-10-28 DIAGNOSIS — Z23 Encounter for immunization: Secondary | ICD-10-CM | POA: Diagnosis not present

## 2021-10-28 DIAGNOSIS — Z1231 Encounter for screening mammogram for malignant neoplasm of breast: Secondary | ICD-10-CM

## 2021-10-28 DIAGNOSIS — E7800 Pure hypercholesterolemia, unspecified: Secondary | ICD-10-CM | POA: Diagnosis not present

## 2021-10-28 DIAGNOSIS — F419 Anxiety disorder, unspecified: Secondary | ICD-10-CM

## 2021-10-28 DIAGNOSIS — M810 Age-related osteoporosis without current pathological fracture: Secondary | ICD-10-CM | POA: Diagnosis not present

## 2021-10-28 NOTE — Assessment & Plan Note (Signed)
Pt was interested in trying magnesium -- advised magnesium glycinate, take in evening, max 400 mg daily

## 2021-10-28 NOTE — Assessment & Plan Note (Signed)
Discussed fosamax again, recommended dosing is for 5 years. Advised we can repeat dexa 2/24 and then q 2 years after that. Pt is tolerating well with no SE

## 2021-10-28 NOTE — Assessment & Plan Note (Signed)
Sharp elevation in LDL since last visit. Discussed risk, The 10-year ASCVD risk score (Arnett DK, et al., 2019) is: 2.9% and consequences of high LDL cholesterol over time As of now, pt will focus on diet, exercise, repeat panel in 6 mo.  Advised she could try metamucil, fish oil supplement, F/u 6 mo

## 2021-10-28 NOTE — Progress Notes (Signed)
I,Roshena L Chambers,acting as a scribe for [employer], PA-C.,have documented all relevant documentation on the behalf of Mikey Kirschner, PA-C,as directed by  Mikey Kirschner, PA-C while in the presence of Mikey Kirschner, PA-C.     Established patient visit   Patient: Denise Gregory   DOB: 10/31/1960   61 y.o. Female  MRN: [id number] Visit Date: 10/28/2021  Today's healthcare provider: Mikey Kirschner, PA-C   Chief Complaint  Patient presents with   Hyperlipidemia   Subjective    HPI  Lipid/Cholesterol, Follow-up  Last lipid panel Other pertinent labs  Lab Results  Component Value Date   CHOL 296 (H) 10/14/2021   HDL 65 10/14/2021   LDLCALC 217 (H) 10/14/2021   TRIG 85 10/14/2021   CHOLHDL 3.6 01/24/2021   Lab Results  Component Value Date   ALT 16 10/14/2021   AST 20 10/14/2021   PLT 294 10/14/2021   TSH 2.280 11/14/2018     Patient recently had Lipid panel checked. She is here today to discuss results and treatment options.   Symptoms: No chest pain No chest pressure/discomfort  No dyspnea No lower extremity edema  No numbness or tingling of extremity No orthopnea  No palpitations No paroxysmal nocturnal dyspnea  No speech difficulty No syncope   Current diet: in general, a "healthy" diet   Current exercise: weightlifting  The 10-year ASCVD risk score (Arnett DK, et al., 2019) is: 2.9%  ---------------------------------------------------------------------------------------------------   Medications: Outpatient Medications Prior to Visit  Medication Sig   alendronate (FOSAMAX) 10 MG tablet TAKE 1 TABLET(10 MG) BY MOUTH DAILY BEFORE BREAKFAST WITH A FULL GLASS OF WATER AND ON AN EMPTY STOMACH   ALPRAZolam (XANAX) 0.5 MG tablet TAKE 1 TABLET(0.5 MG) BY MOUTH AT BEDTIME AS NEEDED   No facility-administered medications prior to visit.    Review of Systems  Constitutional:  Negative for appetite change, chills, fatigue and fever.   Respiratory:  Negative for chest tightness and shortness of breath.   Cardiovascular:  Negative for chest pain and palpitations.  Gastrointestinal:  Negative for abdominal pain, nausea and vomiting.  Neurological:  Negative for dizziness and weakness.     Objective    BP 104/72 (BP Location: Right Arm, Patient Position: Sitting, Cuff Size: Normal)   Pulse 77   Temp 97.7 F (36.5 C) (Oral)   Resp 14   Wt 134 lb (60.8 kg)   SpO2 99% Comment: room air  BMI 24.51 kg/m    Physical Exam Vitals reviewed.  Constitutional:      Appearance: She is not ill-appearing.  HENT:     Head: Normocephalic.  Eyes:     Conjunctiva/sclera: Conjunctivae normal.  Cardiovascular:     Rate and Rhythm: Normal rate.  Pulmonary:     Effort: Pulmonary effort is normal. No respiratory distress.  Neurological:     General: No focal deficit present.     Mental Status: She is alert and oriented to person, place, and time.  Psychiatric:        Mood and Affect: Mood normal.        Behavior: Behavior normal.      No results found for any visits on 10/28/21.  Assessment & Plan     Problem List Items Addressed This Visit       Musculoskeletal and Integument   Age-related osteoporosis without current pathological fracture    Discussed fosamax again, recommended dosing is for 5 years. Advised we can repeat dexa 2/24 and then  q 2 years after that. Pt is tolerating well with no SE        Other   Anxiety    Pt was interested in trying magnesium -- advised magnesium glycinate, take in evening, max 400 mg daily      Hypercholesterolemia - Primary    Sharp elevation in LDL since last visit. Discussed risk, The 10-year ASCVD risk score (Arnett DK, et al., 2019) is: 2.9% and consequences of high LDL cholesterol over time As of now, pt will focus on diet, exercise, repeat panel in 6 mo.  Advised she could try metamucil, fish oil supplement, F/u 6 mo      Other Visit Diagnoses     Screening  mammogram for breast cancer       Relevant Orders   MM 3D SCREEN BREAST BILATERAL   Need for immunization against influenza       Relevant Orders   Flu Vaccine QUAD 66mo+IM (Fluarix, Fluzone & Alfiuria Quad PF) (Completed)   Need for vaccine for Td (tetanus-diphtheria)       Relevant Orders   Administer Tetanus-diphtheria (Td) vaccine (Completed)      Reminded to schedule colonoscopy.  Return in about 6 months (around 04/29/2022) for CPE.     I, Alfredia Ferguson, PA-C have reviewed all documentation for this visit. The documentation on  10/28/2021 for the exam, diagnosis, procedures, and orders are all accurate and complete.  Alfredia Ferguson, PA-C [facility] [address] [place], [place], [address] Office: [phone] Fax: [phone]   [facility]

## 2021-11-10 ENCOUNTER — Encounter: Payer: Self-pay | Admitting: Physician Assistant

## 2021-12-15 ENCOUNTER — Ambulatory Visit
Admission: RE | Admit: 2021-12-15 | Discharge: 2021-12-15 | Disposition: A | Discharge status: Home / Self Care | Payer: Managed Care, Other (non HMO) | Source: Ambulatory Visit | Attending: Physician Assistant | Admitting: Physician Assistant

## 2021-12-15 DIAGNOSIS — Z1231 Encounter for screening mammogram for malignant neoplasm of breast: Secondary | ICD-10-CM | POA: Insufficient documentation

## 2021-12-16 ENCOUNTER — Other Ambulatory Visit: Payer: Self-pay | Admitting: Physician Assistant

## 2021-12-16 DIAGNOSIS — N6489 Other specified disorders of breast: Secondary | ICD-10-CM

## 2021-12-16 DIAGNOSIS — R928 Other abnormal and inconclusive findings on diagnostic imaging of breast: Secondary | ICD-10-CM

## 2021-12-31 ENCOUNTER — Ambulatory Visit
Admission: RE | Admit: 2021-12-31 | Discharge: 2021-12-31 | Disposition: A | Discharge status: Home / Self Care | Payer: Managed Care, Other (non HMO) | Source: Ambulatory Visit | Attending: Physician Assistant | Admitting: Physician Assistant

## 2021-12-31 DIAGNOSIS — N6489 Other specified disorders of breast: Secondary | ICD-10-CM

## 2021-12-31 DIAGNOSIS — R928 Other abnormal and inconclusive findings on diagnostic imaging of breast: Secondary | ICD-10-CM

## 2022-01-01 ENCOUNTER — Other Ambulatory Visit: Payer: Self-pay | Admitting: Physician Assistant

## 2022-01-01 DIAGNOSIS — R928 Other abnormal and inconclusive findings on diagnostic imaging of breast: Secondary | ICD-10-CM

## 2022-01-01 DIAGNOSIS — N63 Unspecified lump in unspecified breast: Secondary | ICD-10-CM

## 2022-01-27 ENCOUNTER — Ambulatory Visit
Admission: RE | Admit: 2022-01-27 | Discharge: 2022-01-27 | Disposition: A | Discharge status: Home / Self Care | Payer: Managed Care, Other (non HMO) | Source: Ambulatory Visit | Attending: Physician Assistant | Admitting: Physician Assistant

## 2022-01-27 DIAGNOSIS — R928 Other abnormal and inconclusive findings on diagnostic imaging of breast: Secondary | ICD-10-CM | POA: Insufficient documentation

## 2022-01-27 DIAGNOSIS — N63 Unspecified lump in unspecified breast: Secondary | ICD-10-CM | POA: Insufficient documentation

## 2022-01-27 HISTORY — PX: BREAST BIOPSY: SHX20

## 2022-01-27 MED ORDER — LIDOCAINE-EPINEPHRINE 1 %-1:100000 IJ SOLN
8.0000 mL | Freq: Once | INTRAMUSCULAR | Status: AC
  Administered 2022-01-27: 8 mL
  Filled 2022-01-27: qty 8

## 2022-01-27 MED ORDER — LIDOCAINE HCL (PF) 1 % IJ SOLN
2.0000 mL | Freq: Once | INTRAMUSCULAR | Status: AC
  Administered 2022-01-27: 2 mL
  Filled 2022-01-27: qty 2

## 2022-01-28 LAB — SURGICAL PATHOLOGY

## 2022-04-01 ENCOUNTER — Other Ambulatory Visit: Payer: Self-pay | Admitting: Physician Assistant

## 2022-04-01 DIAGNOSIS — F5101 Primary insomnia: Secondary | ICD-10-CM

## 2022-04-26 ENCOUNTER — Other Ambulatory Visit: Payer: Self-pay | Admitting: Physician Assistant

## 2022-04-26 DIAGNOSIS — M810 Age-related osteoporosis without current pathological fracture: Secondary | ICD-10-CM

## 2022-04-28 NOTE — Progress Notes (Unsigned)
I,Sha'taria Tyson,acting as a Neurosurgeon for [employer], PA-C.,have documented all relevant documentation on the behalf of Alfredia Ferguson, PA-C,as directed by  Alfredia Ferguson, PA-C while in the presence of Alfredia Ferguson, PA-C.   Complete physical exam   Patient: Denise Gregory   DOB: Nov 12, 1960   62 y.o. Female  MRN: [id number] Visit Date: 04/29/2022  Today's healthcare provider: Alfredia Ferguson, PA-C   No chief complaint on file.  Subjective    Denise Gregory is a 62 y.o. female who presents today for a complete physical exam.  She reports consuming a general and mostly gluten free and try to limit carbs  diet.  The patient reports doing Tai Chi every other day for 10 minutes and also attends a one hour class once a week.  She generally feels well. She reports sleeping fairly well. She does have additional problems to discuss today.  HPI   She reports some right ear pain/pressure and an enlarged lymph node on the right side of her neck.    Past Medical History:  Diagnosis Date   Anemia    Anxiety    Past Surgical History:  Procedure Laterality Date   ABDOMINAL HYSTERECTOMY     BREAST BIOPSY Right 01/27/2022   u/s bx ribbon 1:00 path pending   BREAST BIOPSY Right 01/27/2022   [place] RT BREAST BX W LOC DEV 1ST LESION IMG BX SPEC US GUIDE 01/27/2022 [facility]   Status post hysterectomy  11/19/2009   Due to fibroids; Still has right ovary   status post left oophorectomy     Due to tubal pregnancy   Social History   Socioeconomic History   Marital status: Married    Spouse name: Not on file   Number of children: Not on file   Years of education: Not on file   Highest education level: Not on file  Occupational History   Not on file  Tobacco Use   Smoking status: Never   Smokeless tobacco: Never  Vaping Use   Vaping Use: Never used  Substance and Sexual Activity   Alcohol use: Yes    Comment: 1-2 glasses a week   Drug use: No   Sexual activity:  Not on file  Other Topics Concern   Not on file  Social History Narrative   Not on file   Social Determinants of Health   Financial Resource Strain: Not on file  Food Insecurity: Not on file  Transportation Needs: Not on file  Physical Activity: Not on file  Stress: Not on file  Social Connections: Not on file  Intimate Partner Violence: Not on file   Family Status  Relation Name Status   Father  Deceased at age 28       Died from Lung Cancer   Mother  Alive   PGM  (Not Specified)       senile Osteoporosis   Neg Hx  (Not Specified)   Family History  Problem Relation Age of Onset   Coronary artery disease Father    Heart attack Father    Liver cancer Father    Breast cancer Neg Hx    Allergies  Allergen Reactions   Codeine Nausea Only    Patient Care Team: Alfredia Ferguson, PA-C as PCP - General (Physician Assistant)   Medications: Outpatient Medications Prior to Visit  Medication Sig   alendronate (FOSAMAX) 10 MG tablet TAKE 1 TABLET(10 MG) BY MOUTH DAILY BEFORE BREAKFAST WITH A FULL GLASS OF WATER AND ON  AN EMPTY STOMACH   ALPRAZolam (XANAX) 0.5 MG tablet TAKE 1 TABLET(0.5 MG) BY MOUTH AT BEDTIME AS NEEDED   No facility-administered medications prior to visit.    Review of Systems  HENT:  Positive for ear pain.   Psychiatric/Behavioral:  The patient is nervous/anxious.     Objective    BP 109/76 (BP Location: Right Arm, Patient Position: Sitting, Cuff Size: Normal)   Pulse 72   Ht 5\' 2"  (1.575 m)   Wt 133 lb 6.4 oz (60.5 kg)   SpO2 100%   BMI 24.40 kg/m   Physical Exam Constitutional:      General: She is awake.     Appearance: She is well-developed. She is not ill-appearing.  HENT:     Head: Normocephalic.     Right Ear: Tympanic membrane normal.     Left Ear: Tympanic membrane normal.     Nose: Nose normal. No congestion or rhinorrhea.     Mouth/Throat:     Pharynx: No oropharyngeal exudate or posterior oropharyngeal erythema.  Eyes:      Conjunctiva/sclera: Conjunctivae normal.     Pupils: Pupils are equal, round, and reactive to light.  Neck:     Thyroid: No thyroid mass or thyromegaly.     Comments: Small < 1 cm mobile soft lymph node right tonsilar  Cardiovascular:     Rate and Rhythm: Normal rate and regular rhythm.     Heart sounds: Normal heart sounds.  Pulmonary:     Effort: Pulmonary effort is normal.     Breath sounds: Normal breath sounds.  Abdominal:     Palpations: Abdomen is soft.     Tenderness: There is no abdominal tenderness.  Musculoskeletal:     Right lower leg: No swelling. No edema.     Left lower leg: No swelling. No edema.  Lymphadenopathy:     Cervical: No cervical adenopathy.  Skin:    General: Skin is warm.  Neurological:     Mental Status: She is alert and oriented to person, place, and time.  Psychiatric:        Attention and Perception: Attention normal.        Mood and Affect: Mood normal.        Speech: Speech normal.        Behavior: Behavior normal. Behavior is cooperative.      Last depression screening scores    04/29/2022    8:42 AM 01/24/2021    1:51 PM 11/22/2019    9:07 AM  PHQ 2/9 Scores  PHQ - 2 Score 0 0 0  PHQ- 9 Score 2     Last fall risk screening    04/29/2022    8:42 AM  Fall Risk   Falls in the past year? 0  Number falls in past yr: 0  Injury with Fall? 0  Risk for fall due to : No Fall Risks  Follow up Falls evaluation completed   Last Audit-C alcohol use screening    04/29/2022    8:42 AM  Alcohol Use Disorder Test (AUDIT)  1. How often do you have a drink containing alcohol? 3  2. How many drinks containing alcohol do you have on a typical day when you are drinking? 0  3. How often do you have six or more drinks on one occasion? 1  AUDIT-C Score 4   A score of 3 or more in women, and 4 or more in men indicates increased risk for alcohol abuse, EXCEPT if  all of the points are from question 1   No results found for any visits on 04/29/22.   Assessment & Plan    Routine Health Maintenance and Physical Exam  Exercise Activities and Dietary recommendations --balanced diet high in fiber and protein, low in sugars, carbs, fats. --physical activity/exercise 30 minutes 3-5 times a week    Immunization History  Administered Date(s) Administered   Influenza,inj,Quad PF,6+ Mos 10/12/2014, 10/14/2015, 10/26/2016, 10/27/2017, 11/14/2018, 10/09/2019, 10/28/2021   PFIZER(Purple Top)SARS-COV-2 Vaccination 03/24/2019, 04/14/2019   Td 09/20/1990, 08/09/2003, 10/28/2021   Tdap 05/06/2010   Zoster Recombinat (Shingrix) 11/14/2018, 01/18/2019    Health Maintenance  Topic Date Due   COLONOSCOPY (Pts 45-3634yrs Insurance coverage will need to be confirmed)  09/23/2020   COVID-19 Vaccine (3 - 2023-24 season) 09/19/2021   INFLUENZA VACCINE  08/20/2022   MAMMOGRAM  01/01/2023   DTaP/Tdap/Td (5 - Td or Tdap) 10/29/2031   Hepatitis C Screening  Completed   HIV Screening  Completed   Zoster Vaccines- Shingrix  Completed   HPV VACCINES  Aged Out    Discussed health benefits of physical activity, and encouraged her to engage in regular exercise appropriate for her age and condition.  Problem List Items Addressed This Visit       Other   Avitaminosis D    Recommending consistency with supplement Repeat vit d level      Relevant Orders   Vitamin D (25 hydroxy)   Hypercholesterolemia    Pt prefers to stay off of cholesterol meds Will repeat fasting lipids today. The 10-year ASCVD risk score (Arnett DK, et al., 2019) is: 3.5%       Relevant Orders   Comprehensive Metabolic Panel (CMET)   Lipid Profile   CBC w/Diff/Platelet   Other Visit Diagnoses     Annual physical exam    -  Primary   Colon cancer screening       Relevant Orders   Ambulatory referral to Gastroenterology   Eustachian tube dysfunction, right       Relevant Medications   fluticasone (FLONASE) 50 MCG/ACT nasal spray       Eustachian tube  dysfunction -recommending flonase , antihistamines Return in about 1 year (around 04/29/2023) for CPE.     I, Alfredia FergusonLindsay Haillee Johann, PA-C have reviewed all documentation for this visit. The documentation on  04/29/22 for the exam, diagnosis, procedures, and orders are all accurate and complete.  Alfredia FergusonLindsay Annais Crafts, PA-C [facility] [address] [place], [place], [address] Office: [phone] Fax: [phone]   [facility]

## 2022-04-29 ENCOUNTER — Encounter: Payer: Self-pay | Admitting: Physician Assistant

## 2022-04-29 ENCOUNTER — Ambulatory Visit (INDEPENDENT_AMBULATORY_CARE_PROVIDER_SITE_OTHER): Payer: Managed Care, Other (non HMO) | Admitting: Physician Assistant

## 2022-04-29 VITALS — BP 109/76 | HR 72 | Ht 62.0 in | Wt 133.4 lb

## 2022-04-29 DIAGNOSIS — E7800 Pure hypercholesterolemia, unspecified: Secondary | ICD-10-CM | POA: Diagnosis not present

## 2022-04-29 DIAGNOSIS — E559 Vitamin D deficiency, unspecified: Secondary | ICD-10-CM

## 2022-04-29 DIAGNOSIS — Z0000 Encounter for general adult medical examination without abnormal findings: Secondary | ICD-10-CM | POA: Diagnosis not present

## 2022-04-29 DIAGNOSIS — Z1211 Encounter for screening for malignant neoplasm of colon: Secondary | ICD-10-CM

## 2022-04-29 DIAGNOSIS — H6991 Unspecified Eustachian tube disorder, right ear: Secondary | ICD-10-CM

## 2022-04-29 MED ORDER — FLUTICASONE PROPIONATE 50 MCG/ACT NA SUSP
2.0000 | Freq: Every day | NASAL | 6 refills | Status: DC
Start: 2022-04-29 — End: 2022-11-30

## 2022-04-29 NOTE — Assessment & Plan Note (Signed)
Recommending consistency with supplement Repeat vit d level

## 2022-04-29 NOTE — Assessment & Plan Note (Signed)
Pt prefers to stay off of cholesterol meds Will repeat fasting lipids today. The 10-year ASCVD risk score (Arnett DK, et al., 2019) is: 3.5%

## 2022-04-30 LAB — LIPID PANEL
Chol/HDL Ratio: 3.8 ratio (ref 0.0–4.4)
Cholesterol, Total: 300 mg/dL — ABNORMAL HIGH (ref 100–199)
HDL: 80 mg/dL (ref >39)
LDL Chol Calc (NIH): 201 mg/dL — ABNORMAL HIGH (ref 0–99)
Triglycerides: 111 mg/dL (ref 0–149)
VLDL Cholesterol Cal: 19 mg/dL (ref 5–40)

## 2022-04-30 LAB — CBC WITH DIFFERENTIAL/PLATELET
Basophils Absolute: 0 10*3/uL (ref 0.0–0.2)
Basos: 0 %
EOS (ABSOLUTE): 0.1 10*3/uL (ref 0.0–0.4)
Eos: 1 %
Hematocrit: 40.7 % (ref 34.0–46.6)
Hemoglobin: 13.9 g/dL (ref 11.1–15.9)
Immature Grans (Abs): 0 10*3/uL (ref 0.0–0.1)
Immature Granulocytes: 0 %
Lymphocytes Absolute: 2.6 10*3/uL (ref 0.7–3.1)
Lymphs: 33 %
MCH: 32.1 pg (ref 26.6–33.0)
MCHC: 34.2 g/dL (ref 31.5–35.7)
MCV: 94 fL (ref 79–97)
Monocytes Absolute: 0.7 10*3/uL (ref 0.1–0.9)
Monocytes: 9 %
Neutrophils Absolute: 4.5 10*3/uL (ref 1.4–7.0)
Neutrophils: 57 %
Platelets: 282 10*3/uL (ref 150–450)
RBC: 4.33 x10E6/uL (ref 3.77–5.28)
RDW: 12.7 % (ref 11.7–15.4)
WBC: 7.9 10*3/uL (ref 3.4–10.8)

## 2022-04-30 LAB — COMPREHENSIVE METABOLIC PANEL
ALT: 13 IU/L (ref 0–32)
AST: 15 IU/L (ref 0–40)
Albumin/Globulin Ratio: 2 (ref 1.2–2.2)
Albumin: 4.7 g/dL (ref 3.9–4.9)
Alkaline Phosphatase: 58 IU/L (ref 44–121)
BUN/Creatinine Ratio: 12 (ref 12–28)
BUN: 8 mg/dL (ref 8–27)
Bilirubin Total: 0.3 mg/dL (ref 0.0–1.2)
CO2: 22 mmol/L (ref 20–29)
Calcium: 10 mg/dL (ref 8.7–10.3)
Chloride: 103 mmol/L (ref 96–106)
Creatinine, Ser: 0.66 mg/dL (ref 0.57–1.00)
Globulin, Total: 2.4 g/dL (ref 1.5–4.5)
Glucose: 95 mg/dL (ref 70–99)
Potassium: 5.1 mmol/L (ref 3.5–5.2)
Sodium: 143 mmol/L (ref 134–144)
Total Protein: 7.1 g/dL (ref 6.0–8.5)
eGFR: 99 mL/min/{1.73_m2} (ref >59)

## 2022-04-30 LAB — VITAMIN D 25 HYDROXY (VIT D DEFICIENCY, FRACTURES): Vit D, 25-Hydroxy: 25.2 ng/mL — ABNORMAL LOW (ref 30.0–100.0)

## 2022-09-28 ENCOUNTER — Ambulatory Visit: Payer: Managed Care, Other (non HMO)

## 2022-09-28 DIAGNOSIS — K6389 Other specified diseases of intestine: Secondary | ICD-10-CM | POA: Diagnosis not present

## 2022-09-28 DIAGNOSIS — Z09 Encounter for follow-up examination after completed treatment for conditions other than malignant neoplasm: Secondary | ICD-10-CM | POA: Diagnosis present

## 2022-09-28 DIAGNOSIS — Z86010 Personal history of colonic polyps: Secondary | ICD-10-CM | POA: Diagnosis not present

## 2022-10-25 ENCOUNTER — Other Ambulatory Visit: Payer: Self-pay | Admitting: Physician Assistant

## 2022-10-25 DIAGNOSIS — M810 Age-related osteoporosis without current pathological fracture: Secondary | ICD-10-CM

## 2022-10-26 NOTE — Telephone Encounter (Signed)
Requested medications are due for refill today.  yes  Requested medications are on the active medications list.  yes  Last refill. 4/8/024 #90 1 rf  Future visit scheduled.   yes  Notes to clinic.  Missing labs.    Requested Prescriptions  Pending Prescriptions Disp Refills   alendronate (FOSAMAX) 10 MG tablet [Pharmacy Med Name: ALENDRONATE 10MG  TABLETS] 90 tablet 1    Sig: TAKE 1 TABLET(10 MG) BY MOUTH DAILY BEFORE BREAKFAST WITH A FULL GLASS OF WATER AND ON AN EMPTY STOMACH     Endocrinology:  Bisphosphonates Failed - 10/25/2022  3:38 AM      Failed - Vitamin D in normal range and within 360 days    Vit D, 25-Hydroxy  Date Value Ref Range Status  04/29/2022 25.2 (L) 30.0 - 100.0 ng/mL Final    Comment:    Vitamin D deficiency has been defined by the Institute of Medicine and an Endocrine Society practice guideline as a level of serum 25-OH vitamin D less than 20 ng/mL (1,2). The Endocrine Society went on to further define vitamin D insufficiency as a level between 21 and 29 ng/mL (2). 1. IOM (Institute of Medicine). 2010. Dietary reference    intakes for calcium and D. [place]: The    [employer]. 2. Holick MF, Binkley [place], Bischoff-Ferrari HA, et al.    Evaluation, treatment, and prevention of vitamin D    deficiency: an Endocrine Society clinical practice    guideline. JCEM. 2011 Jul; 96(7):1911-30.          Failed - Mg Level in normal range and within 360 days    No results found for: "MG"       Failed - Phosphate in normal range and within 360 days    No results found for: "PHOS"       Passed - Ca in normal range and within 360 days    Calcium  Date Value Ref Range Status  04/29/2022 10.0 8.7 - 10.3 mg/dL Final         Passed - Cr in normal range and within 360 days    Creatinine, Ser  Date Value Ref Range Status  04/29/2022 0.66 0.57 - 1.00 mg/dL Final         Passed - eGFR is 30 or above and within 360 days    GFR calc Af Amer  Date  Value Ref Range Status  11/14/2018 123 >59 mL/min/1.73 Final   GFR calc non Af Amer  Date Value Ref Range Status  11/14/2018 107 >59 mL/min/1.73 Final   eGFR  Date Value Ref Range Status  04/29/2022 99 >59 mL/min/1.73 Final         Passed - Valid encounter within last 12 months    Recent Outpatient Visits           6 months ago Annual physical exam   [facility] Alfredia Ferguson, PA-C   12 months ago Hypercholesterolemia   [facility] Alfredia Ferguson, PA-C   1 year ago Age-related osteoporosis without current pathological fracture   [facility] Alfredia Ferguson, PA-C   1 year ago Annual physical exam   [facility] Alfredia Ferguson, PA-C   2 years ago Annual physical exam   [facility] Joycelyn Man M, [place]       Future Appointments             In 6 months  Sherlyn Hay, DO [facility]            Passed - Bone Mineral Density or Dexa Scan completed in the last 2 years

## 2022-11-29 ENCOUNTER — Other Ambulatory Visit: Payer: Self-pay | Admitting: Physician Assistant

## 2022-11-29 DIAGNOSIS — H6991 Unspecified Eustachian tube disorder, right ear: Secondary | ICD-10-CM

## 2023-01-07 IMAGING — MG MM DIGITAL SCREENING BILAT W/ TOMO AND CAD
8 series · 8 of 24 positions shown · non-contrast
Comparison: Previous exam(s).

CLINICAL DATA: Screening.

EXAM:
DIGITAL SCREENING BILATERAL MAMMOGRAM WITH TOMOSYNTHESIS AND CAD
TECHNIQUE: Bilateral screening digital craniocaudal and mediolateral oblique
mammograms were obtained. Bilateral screening digital breast
tomosynthesis was performed. The images were evaluated with
computer-aided detection.

[R MLO synth-2D]
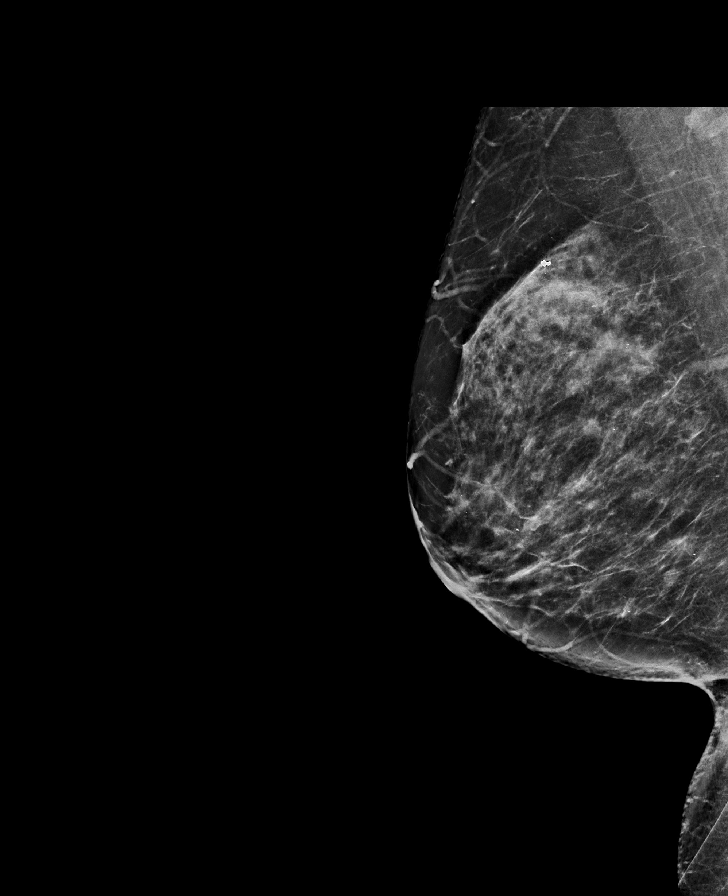

[R CC synth-2D]
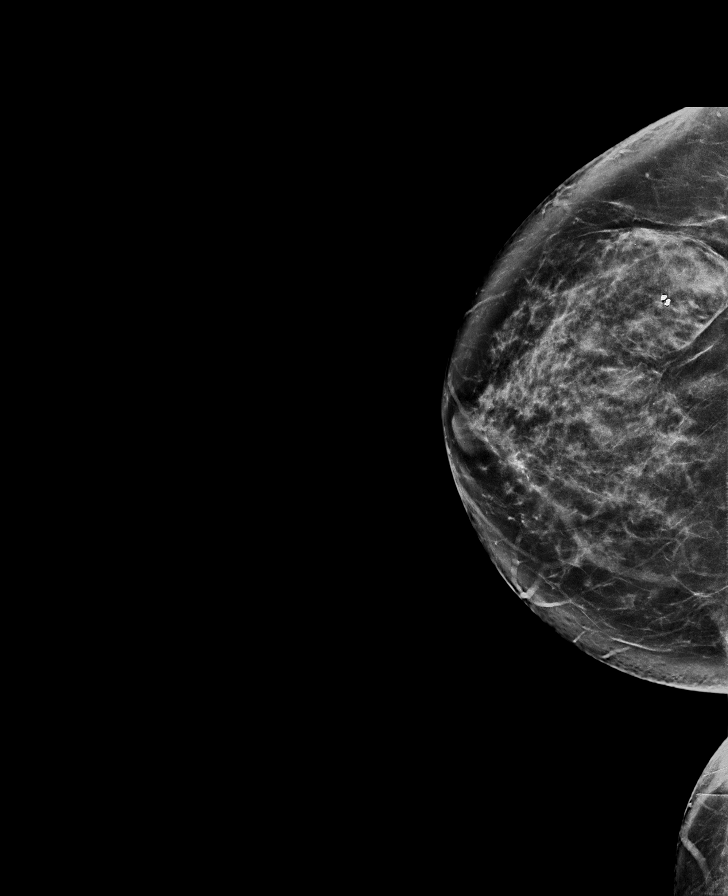

[L CC synth-2D]
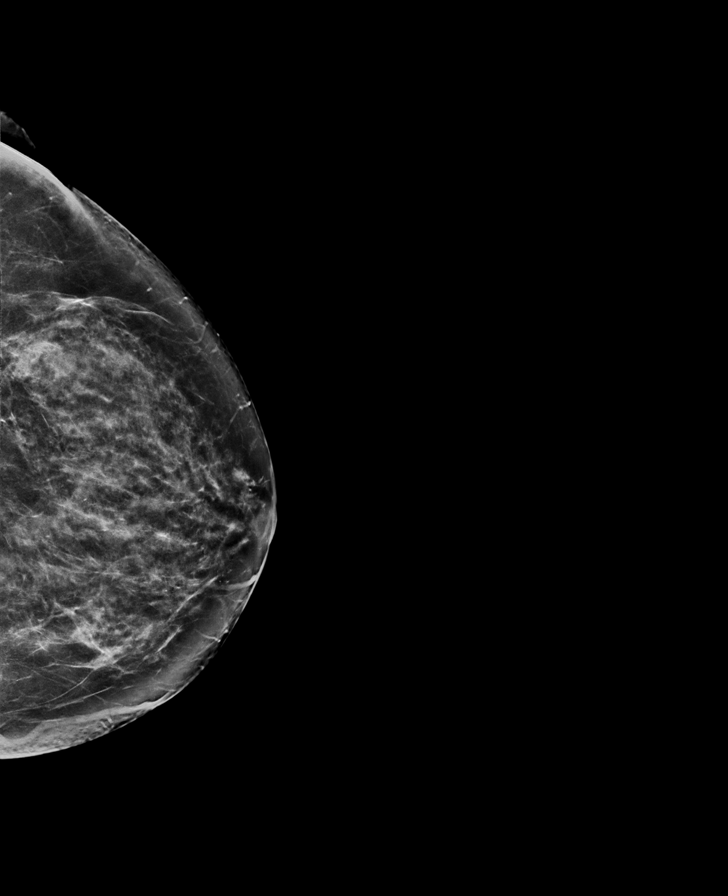

[L MLO synth-2D]
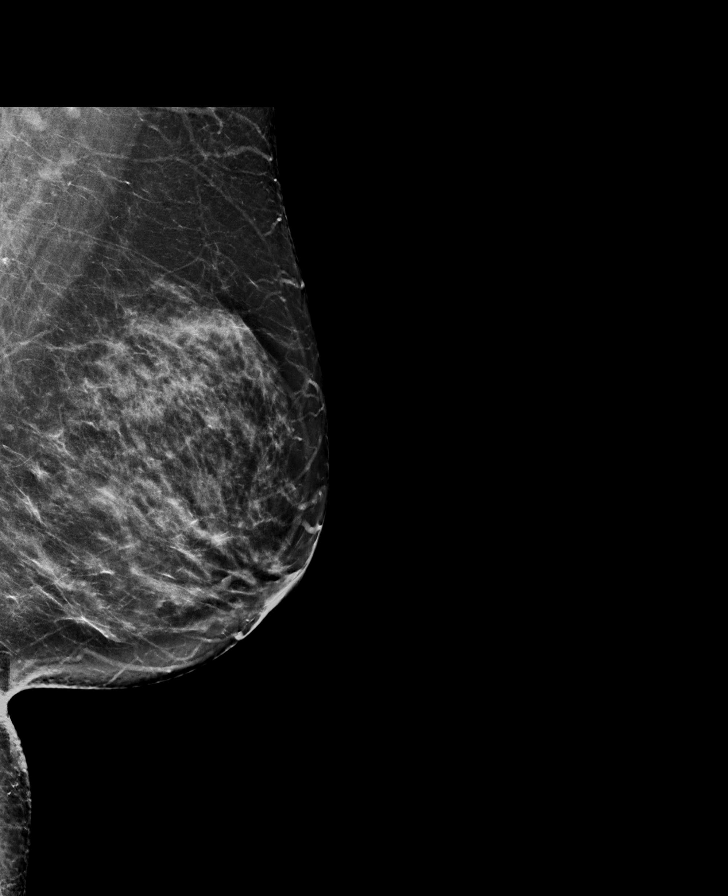

[L CC tomo · tomo slice 39/78.0]
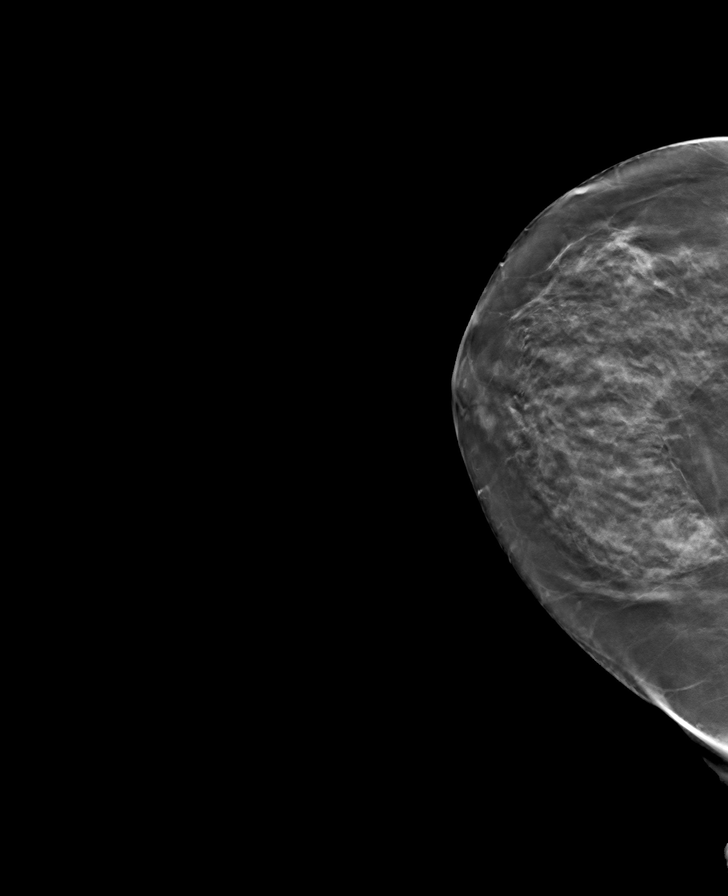

[R CC tomo · tomo slice 40/79.0]
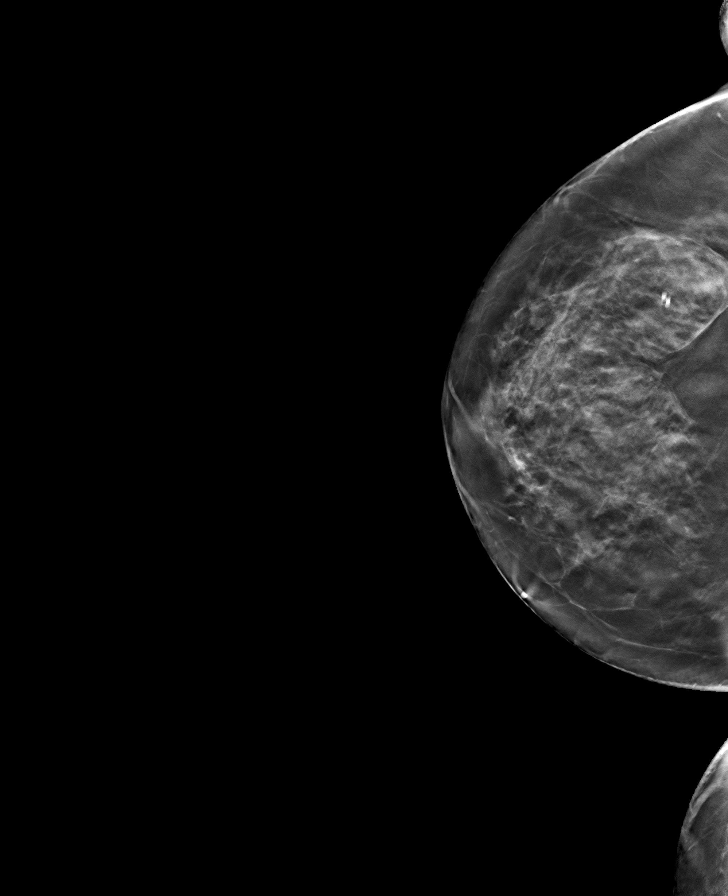

[R MLO tomo · tomo slice 38/75.0]
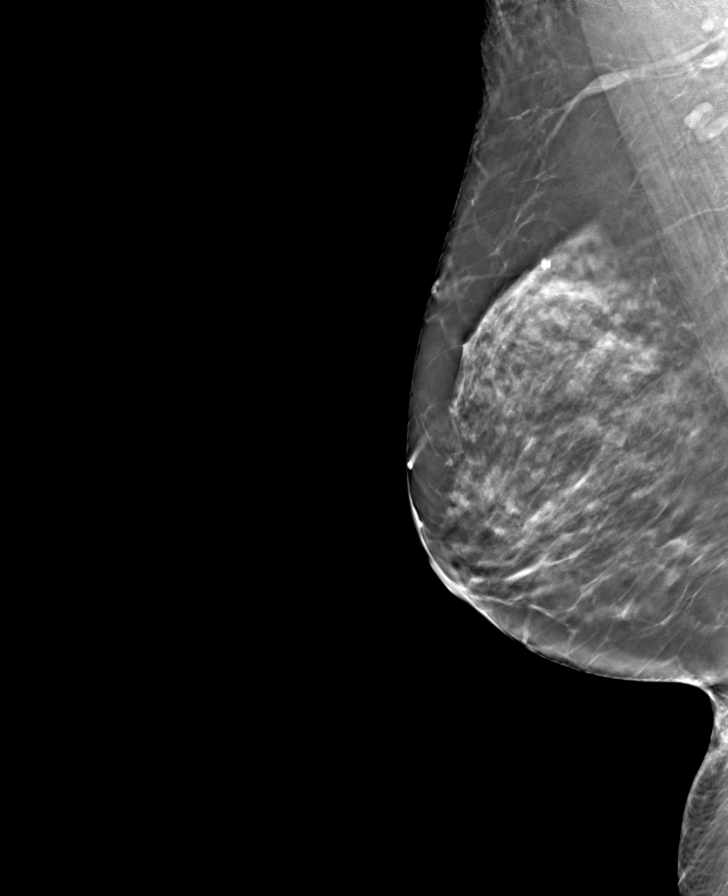

[L MLO tomo · tomo slice 39/78.0]
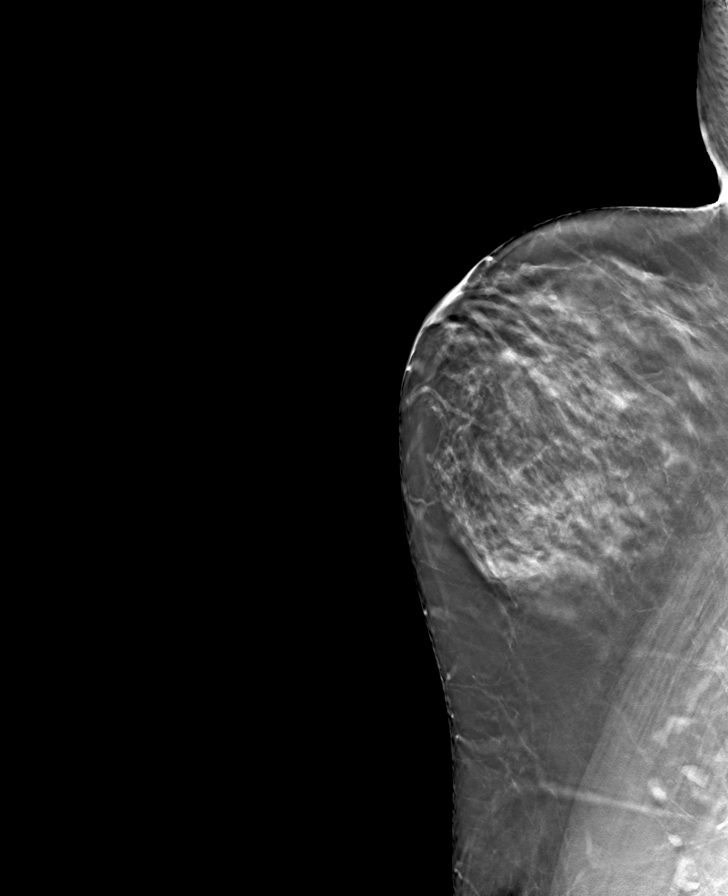

[8 of 24 positions shown; findings below may reference images not displayed]

ACR Breast Density Category c: The breast tissue is heterogeneously
dense, which may obscure small masses.
FINDINGS: There are no findings suspicious for malignancy.
IMPRESSION: No mammographic evidence of malignancy. A result letter of this
screening mammogram will be mailed directly to the patient.

RECOMMENDATION:
Screening mammogram in one year. (Code:[id number]-[id number])

BI-RADS CATEGORY  1: Negative.

## 2023-02-04 ENCOUNTER — Encounter: Payer: Self-pay | Admitting: Family Medicine

## 2023-05-01 ENCOUNTER — Other Ambulatory Visit: Payer: Self-pay | Admitting: Medical Genetics

## 2023-05-03 ENCOUNTER — Other Ambulatory Visit
Admission: RE | Admit: 2023-05-03 | Discharge: 2023-05-03 | Disposition: A | Discharge status: Home / Self Care | Payer: Self-pay | Source: Ambulatory Visit | Attending: Medical Genetics | Admitting: Medical Genetics

## 2023-05-04 ENCOUNTER — Encounter: Payer: Managed Care, Other (non HMO) | Admitting: Physician Assistant

## 2023-05-05 ENCOUNTER — Ambulatory Visit: Payer: Self-pay | Admitting: Family Medicine

## 2023-05-05 ENCOUNTER — Encounter: Payer: Self-pay | Admitting: Family Medicine

## 2023-05-05 VITALS — BP 117/83 | HR 76 | Ht 61.5 in | Wt 140.8 lb

## 2023-05-05 DIAGNOSIS — F5101 Primary insomnia: Secondary | ICD-10-CM

## 2023-05-05 DIAGNOSIS — M810 Age-related osteoporosis without current pathological fracture: Secondary | ICD-10-CM

## 2023-05-05 DIAGNOSIS — E7800 Pure hypercholesterolemia, unspecified: Secondary | ICD-10-CM | POA: Diagnosis not present

## 2023-05-05 DIAGNOSIS — R202 Paresthesia of skin: Secondary | ICD-10-CM

## 2023-05-05 DIAGNOSIS — Z0001 Encounter for general adult medical examination with abnormal findings: Secondary | ICD-10-CM | POA: Diagnosis not present

## 2023-05-05 DIAGNOSIS — N951 Menopausal and female climacteric states: Secondary | ICD-10-CM | POA: Diagnosis not present

## 2023-05-05 DIAGNOSIS — Z0000 Encounter for general adult medical examination without abnormal findings: Secondary | ICD-10-CM

## 2023-05-05 DIAGNOSIS — Z1231 Encounter for screening mammogram for malignant neoplasm of breast: Secondary | ICD-10-CM

## 2023-05-05 DIAGNOSIS — E559 Vitamin D deficiency, unspecified: Secondary | ICD-10-CM

## 2023-05-05 DIAGNOSIS — D509 Iron deficiency anemia, unspecified: Secondary | ICD-10-CM

## 2023-05-05 MED ORDER — ALENDRONATE SODIUM 10 MG PO TABS: 10.0000 mg | ORAL_TABLET | Freq: Every day | ORAL | 3 refills | Status: AC

## 2023-05-05 MED ORDER — ALPRAZOLAM 0.5 MG PO TABS: 0.5000 mg | ORAL_TABLET | Freq: Every evening | ORAL | 1 refills | Status: AC | PRN

## 2023-05-05 MED ORDER — ESTRADIOL 0.5 MG PO TABS: 0.5000 mg | ORAL_TABLET | Freq: Every day | ORAL | 2 refills | Status: DC

## 2023-05-05 MED ORDER — PROGESTERONE MICRONIZED 100 MG PO CAPS: 200.0000 mg | ORAL_CAPSULE | Freq: Every day | ORAL | 2 refills | Status: DC

## 2023-05-05 NOTE — Progress Notes (Signed)
 Complete physical exam   Patient: Denise Gregory   DOB: March 21, 1960   63 y.o. Female  MRN: [id number] Visit Date: 05/05/2023  Today's healthcare provider: Carlean Charter, DO   Chief Complaint  Patient presents with   Annual Exam    Last completed 04/29/22 Diet -  General, well balanced Exercise - Walking at least 2-3 times a week for about 20 minutes Feeling - well Sleeping - poorly due to waking up throughout the night Concerns - hormones    Subjective    Denise Gregory is a 63 y.o. female who presents today for a complete physical exam.    HPI HPI     Annual Exam    Additional comments: Last completed 04/29/22 Diet -  General, well balanced Exercise - Walking at least 2-3 times a week for about 20 minutes Feeling - well Sleeping - poorly due to waking up throughout the night Concerns - hormones       Last edited by Pasty Bongo, CMA on 05/05/2023  8:10 AM.       [name] is a 63 year old female presenting for a discussion on hormone replacement therapy due to menopausal symptoms.  She experiences hot flashes and night sweats, which have persisted for several years since menopause. These symptoms disrupt her sleep, causing her to wake up at night due to sweating, necessitating the use of a fan. She has not attempted any treatments such as SSRIs or clonidine for these symptoms.  She describes emotional instability, characterized by agitation and anxiety, and feels like she is 'going nuts.'  She has gained weight despite efforts to eat less and is frustrated with her weight management.  She reports occasional dizziness associated with positional changes, such as lying down or sitting up, which she attributes to an ear issue. However, this dizziness has not been bothersome recently.  She experiences a sensation of numbness or a 'funny feeling' in the lower half of her legs at night, described as feeling like something is wrapped  around her heels, but it does not cause pain or affect her toes.  She has a history of vitamin D  deficiency and is currently taking alendronate  daily since October 2023 following her bone density test. She has not had a follow-up bone density scan since starting the medication.  She has a history of high cholesterol but has declined medication due to concerns about dementia. Her cholesterol levels remain high as of 2024.  She has a history of iron deficiency anemia, but her recent iron panel was within normal limits.  She underwent a mammogram and biopsy in early 2024, which were normal, and she plans to have another mammogram soon.  She had a hysterectomy 11 years ago but retains one ovary. No history of breast cancer, clots, or vaginal bleeding. She is sexually active and notes that her libido has not been affected.  She has not received the COVID-19 booster vaccine and does not plan to get it.    Past Medical History:  Diagnosis Date   Anemia    Anxiety    Past Surgical History:  Procedure Laterality Date   ABDOMINAL HYSTERECTOMY     BREAST BIOPSY Right 01/27/2022   u/s bx ribbon 1:00 path pending   BREAST BIOPSY Right 01/27/2022   US  RT BREAST BX W LOC DEV 1ST LESION IMG BX SPEC US  GUIDE 01/27/2022 [facility]   Status post hysterectomy  11/19/2009   Due to fibroids; Still has right ovary  status post left oophorectomy     Due to tubal pregnancy   Social History   Socioeconomic History   Marital status: Married    Spouse name: Not on file   Number of children: Not on file   Years of education: Not on file   Highest education level: Bachelor's degree (e.g., BA, AB, BS)  Occupational History   Not on file  Tobacco Use   Smoking status: Never   Smokeless tobacco: Never  Vaping Use   Vaping status: Never Used  Substance and Sexual Activity   Alcohol use: Yes    Alcohol/week: 5.0 standard drinks of alcohol    Types: 5 Glasses of wine per week    Comment: 1-2  glasses a week   Drug use: No   Sexual activity: Not on file  Other Topics Concern   Not on file  Social History Narrative   Not on file   Social Drivers of Health   Financial Resource Strain: Low Risk  (04/30/2023)   Overall Financial Resource Strain (CARDIA)    Difficulty of Paying Living Expenses: Not very hard  Food Insecurity: No Food Insecurity (04/30/2023)   Hunger Vital Sign    Worried About Running Out of Food in the Last Year: Never true    Ran Out of Food in the Last Year: Never true  Transportation Needs: No Transportation Needs (04/30/2023)   PRAPARE - Administrator, Civil Service (Medical): No    Lack of Transportation (Non-Medical): No  Physical Activity: Insufficiently Active (04/30/2023)   Exercise Vital Sign    Days of Exercise per Week: 3 days    Minutes of Exercise per Session: 20 min  Stress: Stress Concern Present (04/30/2023)   [employer] of Occupational Health - Occupational Stress Questionnaire    Feeling of Stress : To some extent  Social Connections: Socially Integrated (04/30/2023)   Social Connection and Isolation Panel [NHANES]    Frequency of Communication with Friends and Family: More than three times a week    Frequency of Social Gatherings with Friends and Family: More than three times a week    Attends Religious Services: More than 4 times per year    Active Member of [employer] or Organizations: Yes    Attends Engineer, structural: More than 4 times per year    Marital Status: Married  Catering manager Violence: Not on file   Family Status  Relation Name Status   Father  Deceased at age 36       Died from Lung Cancer   Mother  Alive   PGM  (Not Specified)       senile Osteoporosis   Neg Hx  (Not Specified)  No partnership data on file   Family History  Problem Relation Age of Onset   Coronary artery disease Father    Heart attack Father    Liver cancer Father    Breast cancer Neg Hx    Allergies  Allergen  Reactions   Codeine Nausea Only    Patient Care Team: Trenton Frock, PA-C as PCP - General (Physician Assistant)   Medications: Outpatient Medications Prior to Visit  Medication Sig   [DISCONTINUED] alendronate  (FOSAMAX ) 10 MG tablet TAKE 1 TABLET(10 MG) BY MOUTH DAILY BEFORE BREAKFAST WITH A FULL GLASS OF WATER AND ON AN EMPTY STOMACH   [DISCONTINUED] ALPRAZolam  (XANAX ) 0.5 MG tablet TAKE 1 TABLET(0.5 MG) BY MOUTH AT BEDTIME AS NEEDED   fluticasone  (FLONASE ) 50 MCG/ACT nasal spray Place  2 sprays into both nostrils daily. (Patient not taking: Reported on 05/05/2023)   No facility-administered medications prior to visit.    Review of Systems  Constitutional:  Negative for chills, fatigue and fever.  HENT:  Negative for congestion, ear pain, rhinorrhea, sneezing and sore throat.   Eyes: Negative.  Negative for pain and redness.  Respiratory:  Negative for cough, shortness of breath and wheezing.   Cardiovascular:  Negative for chest pain and leg swelling.  Gastrointestinal:  Negative for abdominal pain, blood in stool, constipation, diarrhea and nausea.  Endocrine: Positive for heat intolerance (+intermittent hot flashes). Negative for polydipsia, polyphagia and polyuria.  Genitourinary: Negative.  Negative for dysuria, flank pain, hematuria, pelvic pain, vaginal bleeding and vaginal discharge.  Musculoskeletal:  Negative for arthralgias, back pain, gait problem and joint swelling.  Skin:  Negative for rash.  Neurological:  Positive for dizziness (intermittent vertigo). Negative for tremors, seizures, weakness, light-headedness, numbness and headaches.  Hematological:  Negative for adenopathy.  Psychiatric/Behavioral: Negative.  Negative for behavioral problems, confusion and dysphoric mood. The patient is not nervous/anxious and is not hyperactive.       Objective    BP 117/83 (BP Location: Right Arm, Patient Position: Sitting, Cuff Size: Normal)   Pulse 76   Ht 5' 1.5" (1.562 m)    Wt 140 lb 12.8 oz (63.9 kg)   SpO2 100%   BMI 26.17 kg/m     Physical Exam Vitals and nursing note reviewed.  Constitutional:      General: She is awake.     Appearance: Normal appearance.  HENT:     Head: Normocephalic and atraumatic.     Right Ear: Tympanic membrane, ear canal and external ear normal.     Left Ear: Tympanic membrane, ear canal and external ear normal.     Nose: Nose normal.     Mouth/Throat:     Mouth: Mucous membranes are moist.     Pharynx: Oropharynx is clear. No oropharyngeal exudate or posterior oropharyngeal erythema.  Eyes:     General: No scleral icterus.    Extraocular Movements: Extraocular movements intact.     Conjunctiva/sclera: Conjunctivae normal.     Pupils: Pupils are equal, round, and reactive to light.  Neck:     Thyroid: No thyromegaly or thyroid tenderness.  Cardiovascular:     Rate and Rhythm: Normal rate and regular rhythm.     Pulses: Normal pulses.     Heart sounds: Normal heart sounds.  Pulmonary:     Effort: Pulmonary effort is normal. No tachypnea, bradypnea or respiratory distress.     Breath sounds: Normal breath sounds. No stridor. No wheezing, rhonchi or rales.  Abdominal:     General: Bowel sounds are normal. There is no distension.     Palpations: Abdomen is soft. There is no mass.     Tenderness: There is no abdominal tenderness. There is no guarding.     Hernia: No hernia is present.  Musculoskeletal:     Cervical back: Normal range of motion and neck supple.     Right lower leg: No edema.     Left lower leg: No edema.  Lymphadenopathy:     Cervical: No cervical adenopathy.  Skin:    General: Skin is warm and dry.  Neurological:     Mental Status: She is alert and oriented to person, place, and time. Mental status is at baseline.  Psychiatric:        Mood and Affect: Mood normal.  Behavior: Behavior normal.      Last depression screening scores    05/05/2023    8:12 AM 04/29/2022    8:42 AM  01/24/2021    1:51 PM  PHQ 2/9 Scores  PHQ - 2 Score 1 0 0  PHQ- 9 Score 7 2    Last fall risk screening    05/05/2023    8:12 AM  Fall Risk   Falls in the past year? 0  Number falls in past yr: 0  Injury with Fall? 0  Risk for fall due to : No Fall Risks  Follow up Falls evaluation completed   Last Audit-C alcohol use screening    04/30/2023    8:39 AM  Alcohol Use Disorder Test (AUDIT)  1. How often do you have a drink containing alcohol? 3  2. How many drinks containing alcohol do you have on a typical day when you are drinking? 0  3. How often do you have six or more drinks on one occasion? 1  AUDIT-C Score 4   4. How often during the last year have you found that you were not able to stop drinking once you had started? 0  5. How often during the last year have you failed to do what was normally expected from you because of drinking? 0  6. How often during the last year have you needed a first drink in the morning to get yourself going after a heavy drinking session? 0  7. How often during the last year have you had a feeling of guilt of remorse after drinking? 0  8. How often during the last year have you been unable to remember what happened the night before because you had been drinking? 0  9. Have you or someone else been injured as a result of your drinking? 0  10. Has a relative or friend or a doctor or another health worker been concerned about your drinking or suggested you cut down? 0  Alcohol Use Disorder Identification Test Final Score (AUDIT) 4      Patient-reported   A score of 3 or more in women, and 4 or more in men indicates increased risk for alcohol abuse, EXCEPT if all of the points are from question 1   Results for orders placed or performed in visit on 05/05/23  Comprehensive metabolic panel with GFR  Result Value Ref Range   Glucose 89 70 - 99 mg/dL   BUN 7 (L) 8 - 27 mg/dL   Creatinine, Ser 4.09 0.57 - 1.00 mg/dL   eGFR 811 >91 YN/WGN/5.62    BUN/Creatinine Ratio 12 12 - 28   Sodium 143 134 - 144 mmol/L   Potassium 4.4 3.5 - 5.2 mmol/L   Chloride 104 96 - 106 mmol/L   CO2 23 20 - 29 mmol/L   Calcium 9.6 8.7 - 10.3 mg/dL   Total Protein 7.0 6.0 - 8.5 g/dL   Albumin 4.6 3.9 - 4.9 g/dL   Globulin, Total 2.4 1.5 - 4.5 g/dL   Bilirubin Total 0.3 0.0 - 1.2 mg/dL   Alkaline Phosphatase 68 44 - 121 IU/L   AST 15 0 - 40 IU/L   ALT 16 0 - 32 IU/L  Lipid panel  Result Value Ref Range   Cholesterol, Total 260 (H) 100 - 199 mg/dL   Triglycerides 87 0 - 149 mg/dL   HDL 75 >13 mg/dL   VLDL Cholesterol Cal 15 5 - 40 mg/dL   LDL Chol Calc (NIH)  170 (H) 0 - 99 mg/dL   Chol/HDL Ratio 3.5 0.0 - 4.4 ratio  VITAMIN D  25 Hydroxy (Vit-D Deficiency, Fractures)  Result Value Ref Range   Vit D, 25-Hydroxy 29.9 (L) 30.0 - 100.0 ng/mL  Vitamin B12  Result Value Ref Range   Vitamin B-12 377 232 - 1,245 pg/mL  CBC  Result Value Ref Range   WBC 8.6 3.4 - 10.8 x10E3/uL   RBC 4.30 3.77 - 5.28 x10E6/uL   Hemoglobin 13.6 11.1 - 15.9 g/dL   Hematocrit 16.1 09.6 - 46.6 %   MCV 94 79 - 97 fL   MCH 31.6 26.6 - 33.0 pg   MCHC 33.7 31.5 - 35.7 g/dL   RDW 04.5 40.9 - 81.1 %   Platelets 294 150 - 450 x10E3/uL    Assessment & Plan    Routine Health Maintenance and Physical Exam  Exercise Activities and Dietary recommendations  Goals   None     Immunization History  Administered Date(s) Administered   Influenza,inj,Quad PF,6+ Mos 10/12/2014, 10/14/2015, 10/26/2016, 10/27/2017, 11/14/2018, 10/09/2019, 10/28/2021   PFIZER(Purple Top)SARS-COV-2 Vaccination 03/24/2019, 04/14/2019   Td 09/20/1990, 08/09/2003, 10/28/2021   Tdap 05/06/2010   Zoster Recombinant(Shingrix ) 11/14/2018, 01/18/2019    Health Maintenance  Topic Date Due   MAMMOGRAM  01/01/2023   COVID-19 Vaccine (3 - 2024-25 season) 10/20/2023 (Originally 09/20/2022)   INFLUENZA VACCINE  08/20/2023   Colonoscopy  10/01/2027   DTaP/Tdap/Td (5 - Td or Tdap) 10/29/2031   Hepatitis C  Screening  Completed   HIV Screening  Completed   Zoster Vaccines- Shingrix   Completed   HPV VACCINES  Aged Out   Meningococcal B Vaccine  Aged Out    Discussed health benefits of physical activity, and encouraged her to engage in regular exercise appropriate for her age and condition.   Annual physical exam  Iron deficiency anemia, unspecified iron deficiency anemia type -     CBC  Vitamin D  deficiency -     VITAMIN D  25 Hydroxy (Vit-D Deficiency, Fractures)  Hypercholesterolemia -     Comprehensive metabolic panel with GFR -     Lipid panel  Age-related osteoporosis without current pathological fracture -     Alendronate  Sodium; Take 1 tablet (10 mg total) by mouth daily before breakfast. Take with a full glass of water on an empty stomach.  Dispense: 90 tablet; Refill: 3  Paresthesias -     Vitamin B12  Encounter for screening mammogram for breast cancer -     3D Screening Mammogram, Left and Right; Future  Vasomotor symptoms due to menopause -     Estradiol ; Take 1 tablet (0.5 mg total) by mouth daily.  Dispense: 30 tablet; Refill: 2 -     Progesterone ; Take 2 capsules (200 mg total) by mouth daily. For the first 12 days each month  Dispense: 24 capsule; Refill: 2  Primary insomnia -     ALPRAZolam ; Take 1 tablet (0.5 mg total) by mouth at bedtime as needed for anxiety.  Dispense: 30 tablet; Refill: 1     Annual physical exam Physical exam overall unremarkable except as noted above. Routine lab work ordered as noted. Missed mammogram in 2024, normal biopsy in January 2023. Next mammogram considered screening. No COVID-19 booster planned. - Schedule a screening mammogram. - Perform blood work as noted.  Menopausal symptoms Discussed HRT options. Low cardiovascular risk makes HRT a safe option. Alternative treatments mentioned but not tried. - Prescribe combination HRT with estrogen daily and progesterone  for the  first 12 days of each month. - Follow up in 1-3  months to assess symptom management and side effects. - Instruct to contact clinic or proceed to the emergency department if experiencing side effects, such as calf pain, swelling, chest pain, or shortness of breath.  Osteoporosis On alendronate  since October 2023, tolerates lower daily dose well. Plan to continue until April 2028, pending reevaluation. - Continue alendronate  daily until April 2028. - Refill alendronate  today.  Vitamin D  deficiency Vitamin D  levels will be monitored with blood work. - Order blood work to check vitamin D  levels.  Hyperlipidemia High cholesterol, not on medication due to dementia concerns. Low ASCVD risk score, recommend lifestyle modifications.  Will recheck lipid panel today and reevaluate.    Return in about 3 months (around 08/04/2023) for recheck HRT.     I discussed the assessment and treatment plan with the patient  The patient was provided an opportunity to ask questions and all were answered. The patient agreed with the plan and demonstrated an understanding of the instructions.   The patient was advised to call back or seek an in-person evaluation if the symptoms worsen or if the condition fails to improve as anticipated.    Carlean Charter, DO  [facility] [phone] (phone) [phone] (fax)  [facility]

## 2023-05-05 NOTE — Patient Instructions (Signed)

## 2023-05-06 LAB — COMPREHENSIVE METABOLIC PANEL WITH GFR
ALT: 16 IU/L (ref 0–32)
AST: 15 IU/L (ref 0–40)
Albumin: 4.6 g/dL (ref 3.9–4.9)
Alkaline Phosphatase: 68 IU/L (ref 44–121)
BUN/Creatinine Ratio: 12 (ref 12–28)
BUN: 7 mg/dL — ABNORMAL LOW (ref 8–27)
Bilirubin Total: 0.3 mg/dL (ref 0.0–1.2)
CO2: 23 mmol/L (ref 20–29)
Calcium: 9.6 mg/dL (ref 8.7–10.3)
Chloride: 104 mmol/L (ref 96–106)
Creatinine, Ser: 0.59 mg/dL (ref 0.57–1.00)
Globulin, Total: 2.4 g/dL (ref 1.5–4.5)
Glucose: 89 mg/dL (ref 70–99)
Potassium: 4.4 mmol/L (ref 3.5–5.2)
Sodium: 143 mmol/L (ref 134–144)
Total Protein: 7 g/dL (ref 6.0–8.5)
eGFR: 101 mL/min/{1.73_m2} (ref >59)

## 2023-05-06 LAB — CBC
Hematocrit: 40.3 % (ref 34.0–46.6)
Hemoglobin: 13.6 g/dL (ref 11.1–15.9)
MCH: 31.6 pg (ref 26.6–33.0)
MCHC: 33.7 g/dL (ref 31.5–35.7)
MCV: 94 fL (ref 79–97)
Platelets: 294 10*3/uL (ref 150–450)
RBC: 4.3 x10E6/uL (ref 3.77–5.28)
RDW: 12.2 % (ref 11.7–15.4)
WBC: 8.6 10*3/uL (ref 3.4–10.8)

## 2023-05-06 LAB — LIPID PANEL
Chol/HDL Ratio: 3.5 ratio (ref 0.0–4.4)
Cholesterol, Total: 260 mg/dL — ABNORMAL HIGH (ref 100–199)
HDL: 75 mg/dL (ref >39)
LDL Chol Calc (NIH): 170 mg/dL — ABNORMAL HIGH (ref 0–99)
Triglycerides: 87 mg/dL (ref 0–149)
VLDL Cholesterol Cal: 15 mg/dL (ref 5–40)

## 2023-05-06 LAB — VITAMIN B12: Vitamin B-12: 377 pg/mL (ref 232–1245)

## 2023-05-06 LAB — VITAMIN D 25 HYDROXY (VIT D DEFICIENCY, FRACTURES): Vit D, 25-Hydroxy: 29.9 ng/mL — ABNORMAL LOW (ref 30.0–100.0)

## 2023-05-14 LAB — GENECONNECT MOLECULAR SCREEN: Genetic Analysis Overall Interpretation: NEGATIVE

## 2023-05-15 ENCOUNTER — Encounter: Payer: Self-pay | Admitting: Family Medicine

## 2023-07-15 ENCOUNTER — Other Ambulatory Visit: Payer: Self-pay

## 2023-07-15 ENCOUNTER — Encounter: Payer: Self-pay | Admitting: Family Medicine

## 2023-07-15 DIAGNOSIS — N951 Menopausal and female climacteric states: Secondary | ICD-10-CM

## 2023-07-15 NOTE — Telephone Encounter (Signed)
 Last seen 3 months ago for CPE.  Wants this changed to 90 day

## 2023-07-20 MED ORDER — PROGESTERONE MICRONIZED 100 MG PO CAPS: 200.0000 mg | ORAL_CAPSULE | Freq: Every day | ORAL | 1 refills | Status: DC

## 2023-07-30 ENCOUNTER — Other Ambulatory Visit: Payer: Self-pay | Admitting: Family Medicine

## 2023-07-30 DIAGNOSIS — N951 Menopausal and female climacteric states: Secondary | ICD-10-CM

## 2023-08-05 ENCOUNTER — Ambulatory Visit: Admitting: Family Medicine

## 2023-08-05 ENCOUNTER — Encounter: Payer: Self-pay | Admitting: Family Medicine

## 2023-08-05 VITALS — BP 108/71 | HR 75 | Resp 14 | Ht 61.0 in | Wt 139.4 lb

## 2023-08-05 DIAGNOSIS — M810 Age-related osteoporosis without current pathological fracture: Secondary | ICD-10-CM

## 2023-08-05 DIAGNOSIS — F419 Anxiety disorder, unspecified: Secondary | ICD-10-CM

## 2023-08-05 DIAGNOSIS — N951 Menopausal and female climacteric states: Secondary | ICD-10-CM | POA: Diagnosis not present

## 2023-08-05 MED ORDER — ESCITALOPRAM OXALATE 10 MG PO TABS: 10.0000 mg | ORAL_TABLET | Freq: Every day | ORAL | 1 refills | Status: DC

## 2023-08-05 MED ORDER — ESTRADIOL 1 MG PO TABS: 1.0000 mg | ORAL_TABLET | Freq: Every day | ORAL | 0 refills | Status: DC

## 2023-08-05 NOTE — Patient Instructions (Addendum)
 Start increased dose of estradiol  (1 mg total) and take it for a month. If, after that month, you feel your symptoms are still not improving, go ahead and start the escitalopram .  Hold of on starting escitalopram  if doing better on increased dose of estradiol . May go ahead and start it if you are still having anxiety you want to address.  Please call the [facility] [phone]) to schedule a routine screening mammogram and bone density scan.

## 2023-08-05 NOTE — Progress Notes (Signed)
 Established patient visit   Patient: Denise Gregory   DOB: [birth year]   63 y.o. Female  MRN: [id number] Visit Date: 08/05/2023  Today's healthcare provider: LAURAINE LOISE BUOY, DO   Chief Complaint  Patient presents with   Follow-up    Hormone replacement therapy, would like blood work done if needed. Does not feel any different but states of no issues   Subjective    HPI Denise Gregory is a 63 year old female who presents with menopausal symptoms and medication management issues.  She has been experiencing breast tenderness since starting hormone therapy, which resolved after the third month. She is on estradiol  and progesterone  but missed a month of progesterone  due to a pharmacy issue, which has since been resolved. She is currently taking estradiol  at a dose of 0.5 mg. She has experienced hot flashes, night sweats, and sleep disturbances, which she hoped would improve with hormone therapy. She also reports mood changes, which she attributes to recent life stressors, including the passing of her mother-in-law and her husband's job loss.  She reports a history of osteoporosis and is on treatment for it. She mentions a family history of osteoporosis, with her grandmother having had significant issues and her mother showing signs of height loss, although not formally diagnosed.  She is not currently taking Flonase , as she has an excess supply at home.      Medications: Outpatient Medications Prior to Visit  Medication Sig   alendronate  (FOSAMAX ) 10 MG tablet Take 1 tablet (10 mg total) by mouth daily before breakfast. Take with a full glass of water on an empty stomach.   ALPRAZolam  (XANAX ) 0.5 MG tablet Take 1 tablet (0.5 mg total) by mouth at bedtime as needed for anxiety.   progesterone  (PROMETRIUM ) 100 MG capsule Take 2 capsules (200 mg total) by mouth daily. For the first 12 days each month   [DISCONTINUED] estradiol  (ESTRACE ) 0.5 MG tablet TAKE 1 TABLET(0.5  MG) BY MOUTH DAILY   [DISCONTINUED] fluticasone  (FLONASE ) 50 MCG/ACT nasal spray Place 2 sprays into both nostrils daily. (Patient not taking: Reported on 05/05/2023)   No facility-administered medications prior to visit.        Objective    BP 108/71 (BP Location: Left Arm, Patient Position: Sitting, Cuff Size: Normal)   Pulse 75   Resp 14   Ht 5' 1 (1.549 m)   Wt 139 lb 6.4 oz (63.2 kg)   SpO2 100%   BMI 26.34 kg/m     Physical Exam Vitals and nursing note reviewed.  Constitutional:      General: She is not in acute distress.    Appearance: Normal appearance.  HENT:     Head: Normocephalic and atraumatic.  Eyes:     General: No scleral icterus.    Conjunctiva/sclera: Conjunctivae normal.  Cardiovascular:     Rate and Rhythm: Normal rate.  Pulmonary:     Effort: Pulmonary effort is normal.  Neurological:     Mental Status: She is alert and oriented to person, place, and time. Mental status is at baseline.  Psychiatric:        Mood and Affect: Mood normal.        Behavior: Behavior normal.      No results found for any visits on 08/05/23.  Assessment & Plan    Vasomotor symptoms due to menopause -     Estradiol ; Take 1 tablet (1 mg total) by mouth daily.  Dispense: 90 tablet; Refill:  0  Anxiety -     Escitalopram  Oxalate; Take 1 tablet (10 mg total) by mouth daily.  Dispense: 30 tablet; Refill: 1  Age-related osteoporosis without current pathological fracture -     DG Bone Density; Future      Vasomotor symptoms due to menopause; anxiety Minimal improvement on estradiol  0.5 mg. Discussed increasing dose to 1 mg. Considered SSRIs like escitalopram  for additional symptom management. Veozah discussed with caution due to liver risk. - Increase estradiol  to 1 mg for one month. - Consider escitalopram  if symptoms persist after one month. - Discuss Veozah if other treatments fail, with liver function monitoring.  Osteoporosis Confirmed osteoporosis with  T-score of -2.5 at spine and -2.0 at femur. Discussed repeating bone density scan due to risk factors and treatment assessment. - Order repeat bone density scan.  General Health Maintenance Due for mammogram as last one was over two years ago. - Advise scheduling a mammogram.  Order placed at previous visit.    Return in about 2 months (around 10/06/2023) for Recheck (perimeno).      I discussed the assessment and treatment plan with the patient  The patient was provided an opportunity to ask questions and all were answered. The patient agreed with the plan and demonstrated an understanding of the instructions.   The patient was advised to call back or seek an in-person evaluation if the symptoms worsen or if the condition fails to improve as anticipated.    LAURAINE LOISE BUOY, DO  [facility] [phone] (phone) [phone] (fax)  [facility]

## 2023-10-06 ENCOUNTER — Encounter: Payer: Self-pay | Admitting: Family Medicine

## 2023-10-06 ENCOUNTER — Ambulatory Visit (INDEPENDENT_AMBULATORY_CARE_PROVIDER_SITE_OTHER): Admitting: Family Medicine

## 2023-10-06 VITALS — BP 116/68 | HR 79 | Wt 139.3 lb

## 2023-10-06 DIAGNOSIS — F419 Anxiety disorder, unspecified: Secondary | ICD-10-CM

## 2023-10-06 DIAGNOSIS — Z23 Encounter for immunization: Secondary | ICD-10-CM | POA: Diagnosis not present

## 2023-10-06 DIAGNOSIS — N951 Menopausal and female climacteric states: Secondary | ICD-10-CM

## 2023-10-06 DIAGNOSIS — M810 Age-related osteoporosis without current pathological fracture: Secondary | ICD-10-CM

## 2023-10-06 NOTE — Patient Instructions (Addendum)
 [facility] testing - Depending on insurance, it can have a copay of up to a maximum of $330 (even if insurance denies it).  If you want a more definite quote of the cost before committing to it, you can contact the company by email at [email or web address] [email or web address] or call [phone].    Please call the [facility] [phone]) to schedule a routine screening mammogram.

## 2023-10-06 NOTE — Progress Notes (Signed)
 Established patient visit   Patient: Denise Gregory   DOB: Dec 06, 1960   63 y.o. Female  MRN: [id number] Visit Date: 10/06/2023  Today's healthcare provider: LAURAINE LOISE BUOY, DO   Chief Complaint  Patient presents with   Follow-up    2 months follow-up (perimeno).    Subjective    HPI Denise Gregory is a 63 year old female who presents with anxiety and menopausal symptoms.  She has a history of anxiety and was previously prescribed Lexapro  but did not like how it made her feel.  She did not realize this until it was represcribed at the last visit, so she did not take it between then and now. Her anxiety is described as 'not terrible' and she is not currently on any medication for it. No thoughts of self-harm or harm to others.  She is experiencing menopausal symptoms, including hot flashes and night sweats, which cause sleep disturbances. She is currently on estradiol , which we recently increased in dosage, but she has not noticed a significant change in her symptoms, except for a small decrease in her hot flashes during the day. She describes the symptoms as 'livable' and notes that they seem to occur less often but are still present.      Medications: Outpatient Medications Prior to Visit  Medication Sig   alendronate  (FOSAMAX ) 10 MG tablet Take 1 tablet (10 mg total) by mouth daily before breakfast. Take with a full glass of water on an empty stomach.   ALPRAZolam  (XANAX ) 0.5 MG tablet Take 1 tablet (0.5 mg total) by mouth at bedtime as needed for anxiety.   estradiol  (ESTRACE ) 1 MG tablet Take 1 tablet (1 mg total) by mouth daily.   progesterone  (PROMETRIUM ) 100 MG capsule Take 2 capsules (200 mg total) by mouth daily. For the first 12 days each month   escitalopram  (LEXAPRO ) 10 MG tablet Take 1 tablet (10 mg total) by mouth daily.   No facility-administered medications prior to visit.        Objective    BP 116/68 (BP Location: Left Arm, Patient  Position: Sitting, Cuff Size: Normal)   Pulse 79   Wt 139 lb 4.8 oz (63.2 kg)   SpO2 100%   BMI 26.32 kg/m     Physical Exam Vitals and nursing note reviewed.  Constitutional:      General: She is not in acute distress.    Appearance: Normal appearance.  HENT:     Head: Normocephalic and atraumatic.  Eyes:     General: No scleral icterus.    Conjunctiva/sclera: Conjunctivae normal.  Cardiovascular:     Rate and Rhythm: Normal rate.  Pulmonary:     Effort: Pulmonary effort is normal.  Neurological:     Mental Status: She is alert and oriented to person, place, and time. Mental status is at baseline.  Psychiatric:        Mood and Affect: Mood normal.        Behavior: Behavior normal.      No results found for any visits on 10/06/23.  Assessment & Plan    Vasomotor symptoms due to menopause  Anxiety  Age-related osteoporosis without current pathological fracture -     DG Bone Density  Immunization due -     Flu vaccine trivalent PF, 6mos and older(Flulaval,Afluria,Fluarix,Fluzone)     Vasomotor symptoms due to menopause (hot flashes, night sweats, sleep disturbance) Persistent symptoms despite increased estradiol . Symptoms are livable but present. - Continue  current estradiol  dosage. - Consider Veozah for vasomotor symptoms if symptoms worsen, with liver enzyme monitoring due to liver failure risk. - Monitor symptoms and reassess treatment efficacy.  Anxiety disorder Anxiety present but not severe. Lexapro  not tolerated previously. - Consider Celexa if pharmacological intervention desired. - Discuss [employer] testing for evaluation of medication processing speed; based on our discussion, patient to contact [employer] for information about exact copay if she would like to be tested. - Monitor anxiety symptoms and reassess need for medication.  General Health Maintenance Discussed pneumonia vaccination. Declined COVID booster. Mammogram and bone density screening  due.  Declines pneumonia vaccine. - Counseled patient on benefits of COVID and pneumonia vaccines.  Patient declines both today. - Order bone density screening. - Patient to call and schedule mammogram and bone density test.   Return in about 7 months (around 05/05/2024) for CPE, Chronic f/u.      I discussed the assessment and treatment plan with the patient  The patient was provided an opportunity to ask questions and all were answered. The patient agreed with the plan and demonstrated an understanding of the instructions.   The patient was advised to call back or seek an in-person evaluation if the symptoms worsen or if the condition fails to improve as anticipated.    LAURAINE LOISE BUOY, DO  [facility] [phone] (phone) [phone] (fax)  [facility]

## 2023-10-28 ENCOUNTER — Ambulatory Visit
Admission: RE | Admit: 2023-10-28 | Discharge: 2023-10-28 | Disposition: A | Discharge status: Home / Self Care | Source: Ambulatory Visit | Attending: Family Medicine | Admitting: Family Medicine

## 2023-10-28 ENCOUNTER — Ambulatory Visit
Admission: RE | Admit: 2023-10-28 | Discharge: 2023-10-28 | Disposition: A | Source: Ambulatory Visit | Attending: Family Medicine | Admitting: Family Medicine

## 2023-10-28 DIAGNOSIS — Z1231 Encounter for screening mammogram for malignant neoplasm of breast: Secondary | ICD-10-CM | POA: Diagnosis present

## 2023-10-28 DIAGNOSIS — M810 Age-related osteoporosis without current pathological fracture: Secondary | ICD-10-CM | POA: Diagnosis present

## 2023-10-29 ENCOUNTER — Other Ambulatory Visit: Payer: Self-pay | Admitting: Family Medicine

## 2023-10-29 DIAGNOSIS — N951 Menopausal and female climacteric states: Secondary | ICD-10-CM

## 2023-10-29 NOTE — Telephone Encounter (Signed)
 LOV- 05/05/2023 NOV- 05/05/2024 LRF- 90 x 0

## 2023-11-05 ENCOUNTER — Encounter: Payer: Self-pay | Admitting: Family Medicine

## 2023-11-09 ENCOUNTER — Ambulatory Visit: Payer: Self-pay | Admitting: Family Medicine

## 2023-11-09 DIAGNOSIS — M810 Age-related osteoporosis without current pathological fracture: Secondary | ICD-10-CM

## 2023-11-09 NOTE — Progress Notes (Signed)
 Called patient to inform her of her results and she was okay with the referral to Endo, placed that referral.

## 2023-11-11 ENCOUNTER — Ambulatory Visit: Payer: Self-pay | Admitting: Family Medicine

## 2024-01-28 ENCOUNTER — Other Ambulatory Visit: Payer: Self-pay | Admitting: Family Medicine

## 2024-01-28 DIAGNOSIS — N951 Menopausal and female climacteric states: Secondary | ICD-10-CM

## 2024-01-30 ENCOUNTER — Other Ambulatory Visit: Payer: Self-pay | Admitting: Family Medicine

## 2024-01-30 DIAGNOSIS — N951 Menopausal and female climacteric states: Secondary | ICD-10-CM

## 2024-01-31 ENCOUNTER — Other Ambulatory Visit: Payer: Self-pay

## 2024-01-31 ENCOUNTER — Telehealth: Payer: Self-pay | Admitting: Family Medicine

## 2024-01-31 NOTE — Telephone Encounter (Signed)
 LOV- 10/06/2023 NOV- 05/05/2024 LRF- 07/20/2023 Outpatient Medication Detail   Disp Refills Start End   progesterone  (PROMETRIUM ) 100 MG capsule 72 capsule 1 07/20/2023 --   Sig - Route: Take 2 capsules (200 mg total) by mouth daily. For the first 12 days each month - Oral   Sent to pharmacy as: progesterone  (PROMETRIUM ) 100 MG capsule   E-Prescribing Status: Receipt confirmed by pharmacy (07/20/2023  9:56 PM EDT)

## 2024-01-31 NOTE — Telephone Encounter (Signed)
 Refill Request   Pharmacy: [facility]  Medication: progesterone  (PROMETRIUM ) 100 MG capsule   Quantity (if provided): 72  Please send in refill request.

## 2024-05-05 ENCOUNTER — Encounter: Admitting: Family Medicine
